# Patient Record
Sex: Female | Born: 1935 | Race: White | Hispanic: No | Marital: Married | Smoking: Former smoker
Health system: Southern US, Community
[De-identification: ages and names within clinical notes are randomized; demographics above are authoritative.]

## PROBLEM LIST (undated history)

## (undated) DIAGNOSIS — C3490 Malignant neoplasm of unspecified part of unspecified bronchus or lung: Secondary | ICD-10-CM

## (undated) DIAGNOSIS — C50919 Malignant neoplasm of unspecified site of unspecified female breast: Secondary | ICD-10-CM

## (undated) DIAGNOSIS — K219 Gastro-esophageal reflux disease without esophagitis: Secondary | ICD-10-CM

## (undated) HISTORY — PX: APPENDECTOMY: SHX54

## (undated) HISTORY — PX: TOTAL HIP ARTHROPLASTY: SHX124

## (undated) HISTORY — PX: GALLBLADDER SURGERY: SHX652

## (undated) HISTORY — PX: CHOLECYSTECTOMY: SHX55

## (undated) HISTORY — DX: Gastro-esophageal reflux disease without esophagitis: K21.9

## (undated) HISTORY — DX: Malignant neoplasm of unspecified part of unspecified bronchus or lung: C34.90

## (undated) HISTORY — PX: COLON RESECTION: SHX5231

## (undated) HISTORY — PX: TONSILLECTOMY: SUR1361

## (undated) HISTORY — DX: Malignant neoplasm of unspecified site of unspecified female breast: C50.919

## (undated) HISTORY — PX: REPLACEMENT TOTAL KNEE: SUR1224

---

## 1970-01-22 DIAGNOSIS — C3490 Malignant neoplasm of unspecified part of unspecified bronchus or lung: Secondary | ICD-10-CM

## 1970-01-22 HISTORY — DX: Malignant neoplasm of unspecified part of unspecified bronchus or lung: C34.90

## 2005-01-22 HISTORY — PX: MASTECTOMY: SHX3

## 2005-06-27 ENCOUNTER — Ambulatory Visit: Payer: Self-pay | Admitting: Gastroenterology

## 2005-08-15 ENCOUNTER — Inpatient Hospital Stay: Payer: Self-pay | Admitting: Vascular Surgery

## 2006-01-18 ENCOUNTER — Ambulatory Visit: Payer: Self-pay | Admitting: Internal Medicine

## 2006-06-27 ENCOUNTER — Ambulatory Visit: Payer: Self-pay | Admitting: Internal Medicine

## 2006-07-02 ENCOUNTER — Ambulatory Visit: Payer: Self-pay | Admitting: Internal Medicine

## 2006-10-21 ENCOUNTER — Ambulatory Visit: Payer: Self-pay | Admitting: General Practice

## 2006-11-04 ENCOUNTER — Inpatient Hospital Stay: Payer: Self-pay | Admitting: General Practice

## 2006-11-08 ENCOUNTER — Encounter: Payer: Self-pay | Admitting: Internal Medicine

## 2007-01-20 ENCOUNTER — Ambulatory Visit: Payer: Self-pay | Admitting: Internal Medicine

## 2007-05-20 ENCOUNTER — Ambulatory Visit: Payer: Self-pay | Admitting: General Practice

## 2007-05-21 ENCOUNTER — Ambulatory Visit: Payer: Self-pay

## 2007-06-18 ENCOUNTER — Ambulatory Visit: Payer: Self-pay | Admitting: Pain Medicine

## 2007-07-03 ENCOUNTER — Ambulatory Visit: Payer: Self-pay | Admitting: Pain Medicine

## 2007-07-16 ENCOUNTER — Ambulatory Visit: Payer: Self-pay | Admitting: Pain Medicine

## 2007-07-17 ENCOUNTER — Other Ambulatory Visit: Payer: Self-pay

## 2007-07-18 ENCOUNTER — Inpatient Hospital Stay: Payer: Self-pay | Admitting: Internal Medicine

## 2007-07-31 ENCOUNTER — Ambulatory Visit: Payer: Self-pay | Admitting: Pain Medicine

## 2007-08-14 ENCOUNTER — Ambulatory Visit: Payer: Self-pay | Admitting: Pain Medicine

## 2007-08-28 ENCOUNTER — Ambulatory Visit: Payer: Self-pay | Admitting: Physician Assistant

## 2008-01-21 ENCOUNTER — Ambulatory Visit: Payer: Self-pay | Admitting: Internal Medicine

## 2008-10-29 ENCOUNTER — Emergency Department: Payer: Self-pay | Admitting: Emergency Medicine

## 2009-02-03 ENCOUNTER — Encounter: Payer: Self-pay | Admitting: Cardiovascular Disease

## 2009-05-05 ENCOUNTER — Ambulatory Visit: Payer: Self-pay | Admitting: Internal Medicine

## 2009-05-10 ENCOUNTER — Ambulatory Visit: Payer: Self-pay | Admitting: Internal Medicine

## 2009-05-17 IMAGING — CR DG ABDOMEN 2V
1 series · 2 of 2 positions shown · non-contrast
Comparison: none

REASON FOR EXAM: abdominal pain, nausea/ vomiting
COMMENTS:

[Series 1: view not recorded · 0.17mm/px · 2 of 2 slices shown]
[im 1/2]
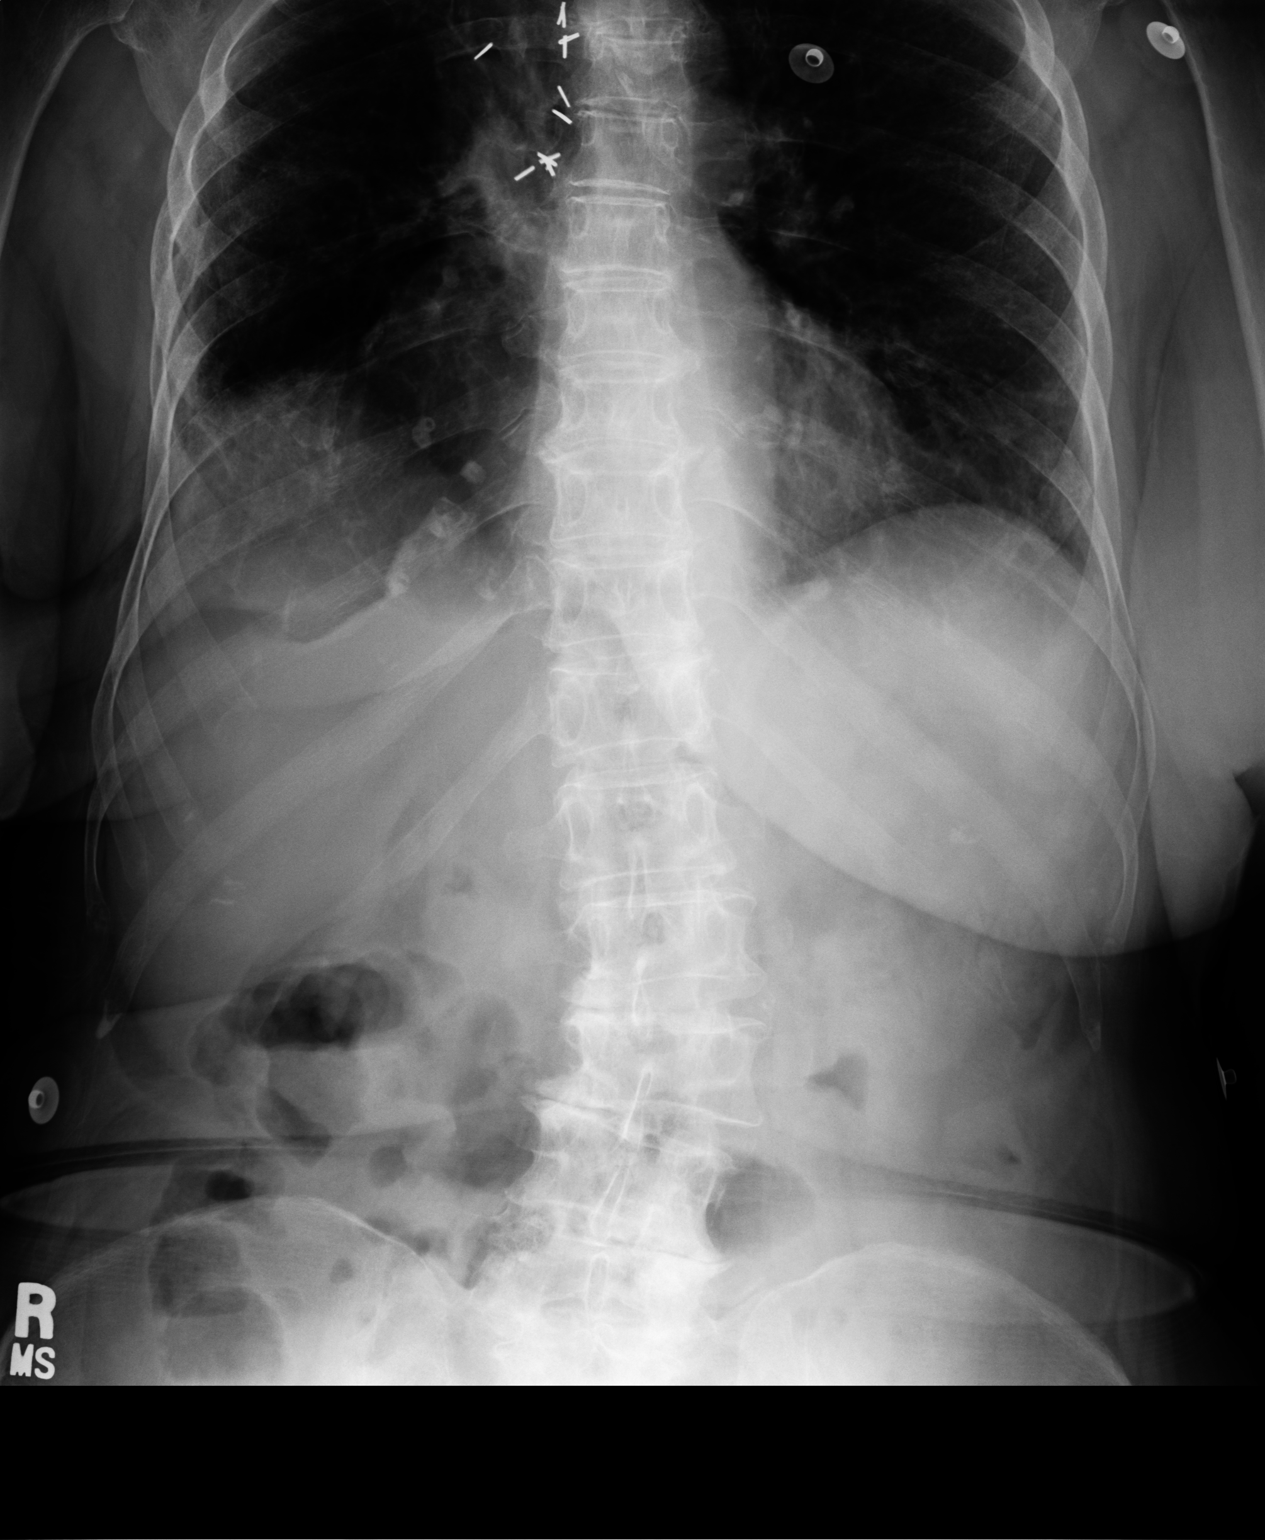
[im 2/2]
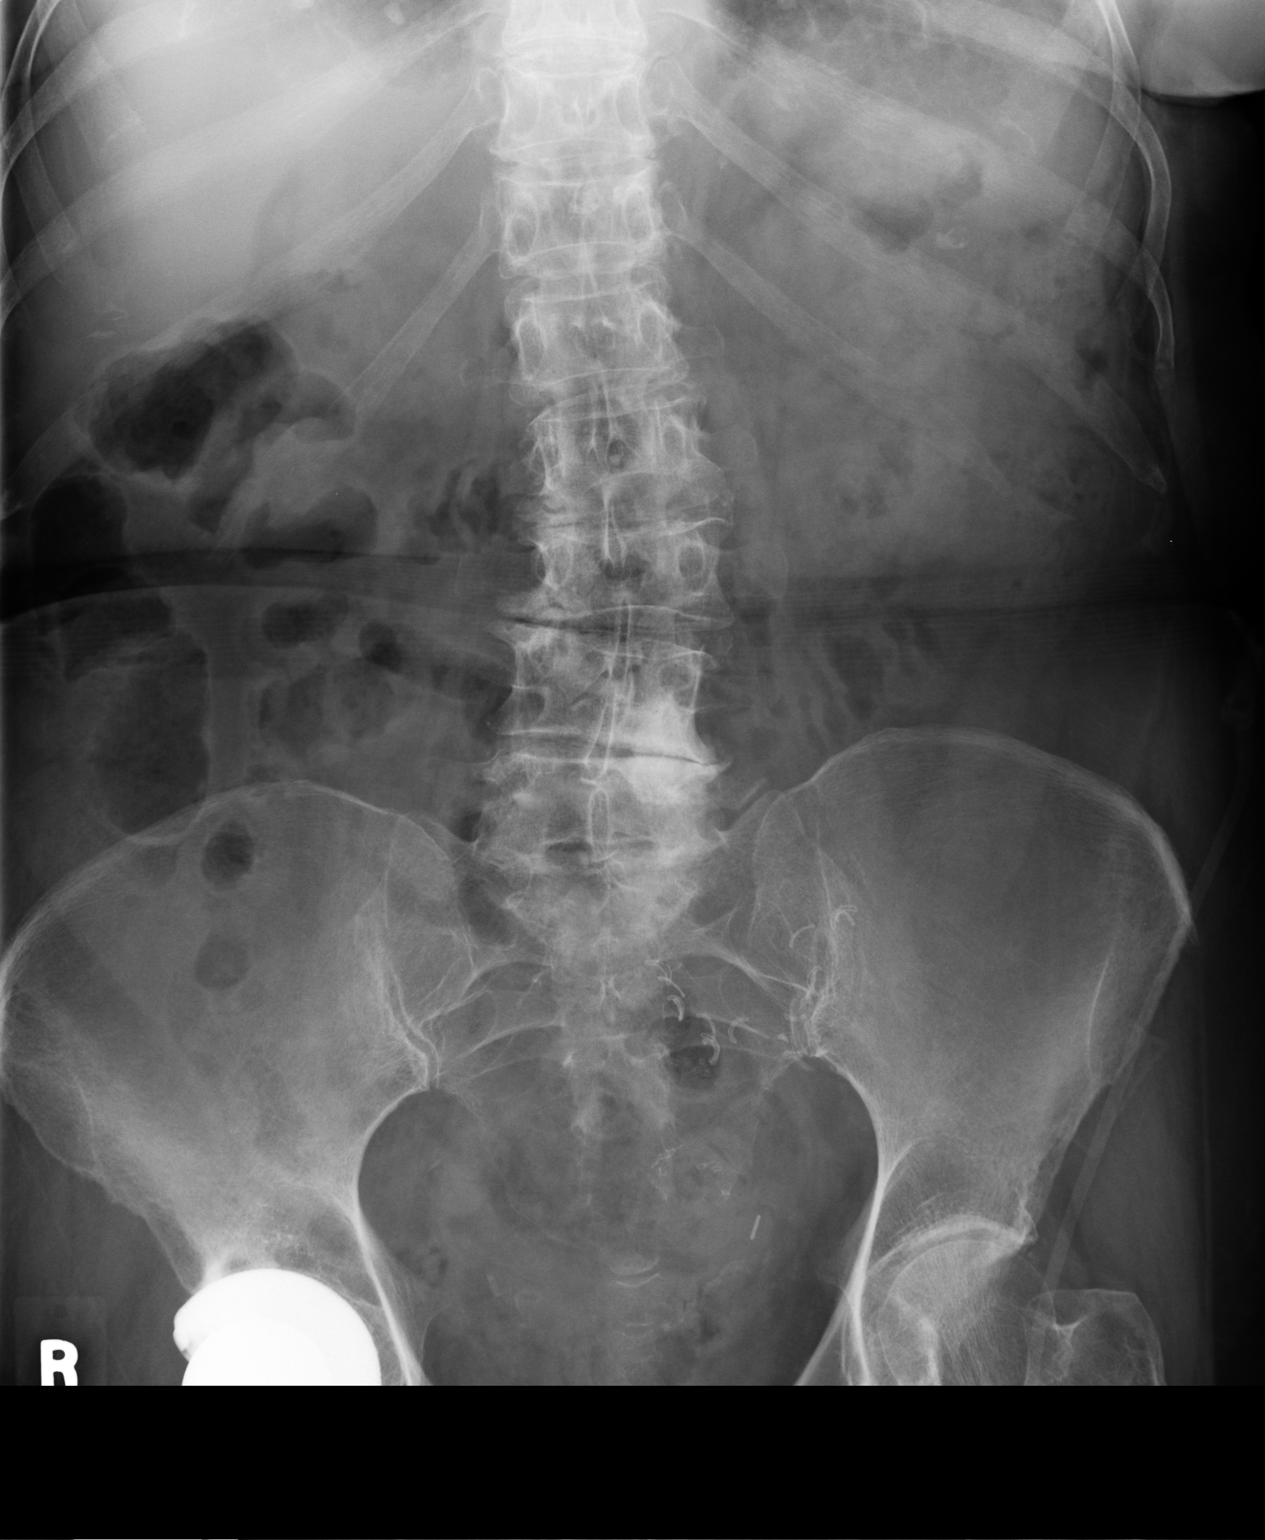

[2 of 2 positions shown; findings below may reference images not displayed]

PROCEDURE:     DXR - DXR ABDOMEN 2 V FLAT AND ERECT  - July 18, 2007  [DATE]

RESULT:     Air is seen within non-dilated loops of large and small bowel.
The visualized bony skeleton demonstrates levoscoliosis. A RIGHT hip
prosthesis is appreciated. Degenerative changes are appreciated within the
lower lumbar spine.
IMPRESSION: 1.Non-obstructive bowel gas pattern as described above.

## 2009-07-18 ENCOUNTER — Ambulatory Visit: Payer: Self-pay | Admitting: Cardiovascular Disease

## 2009-07-18 DIAGNOSIS — R079 Chest pain, unspecified: Secondary | ICD-10-CM

## 2009-07-18 DIAGNOSIS — M79609 Pain in unspecified limb: Secondary | ICD-10-CM

## 2009-08-11 ENCOUNTER — Encounter: Payer: Self-pay | Admitting: Cardiovascular Disease

## 2009-08-29 ENCOUNTER — Encounter: Payer: Self-pay | Admitting: Cardiovascular Disease

## 2009-09-16 ENCOUNTER — Ambulatory Visit: Payer: Self-pay | Admitting: Gastroenterology

## 2009-09-20 LAB — PATHOLOGY REPORT

## 2009-10-10 ENCOUNTER — Inpatient Hospital Stay (HOSPITAL_COMMUNITY): Admission: RE | Admit: 2009-10-10 | Discharge: 2009-10-12 | Payer: Self-pay | Admitting: Orthopedic Surgery

## 2010-02-21 NOTE — Assessment & Plan Note (Signed)
Summary: NP6/AMD   Visit Type:  Initial Consult Referring Provider:  Dr. Valentina Gu Primary Provider:  Irving Shows.  CC:  Needs a cardiac clearance for right knee surg.Angela Bridges  History of Present Illness: [name] is a very pleasant 77 year old woman with a history of lung cancer, status post lobectomy at age 13, mastectomy in 2005, severe diverticulosis status post colectomy, right hip replacement in 2009 now presenting with severe right knee discomfort with plan for right knee replacement. She presents for preoperative evaluation.  She states that over the past 2-3 years she has had episodes of chest discomfort. She describes it as a tightness. With these symptoms, she has fatigue, malaise, GI upset. Rarely, she has to take nitroglycerin. Her last episode was more than several months ago. Over the past 2-3 years, the episodes have been getting less frequent. She had a echocardiogram and stress test in 2009 at [facility]. She was told at that time that she had valve problems with 2 valves. The stress test was reportedly normal.  Currently she feels well. She is able to do walking, sharp for herself, clean house. She does not push vacuum to have someone help her with that. Activity does not cause chest discomfort  EKG shows normal sinus rhythm with a rate of 67 beats per minute, no significant ST or T wave changes.  Preventive Screening-Counseling & Management  Alcohol-Tobacco     Smoking Status: never  Caffeine-Diet-Exercise     Does Patient Exercise: no      Drug Use:  no.     Current Medications (verified): 1)  Aspir-Low 81 Mg Tbec (Aspirin) .Angela Bridges.. 1 Qd 2)  Tramadol Hcl 50 Mg Tabs (Tramadol Hcl) .... 2 At Bedtime 1 Every Am 3)  Tylenol Arthritis Pain 650 Mg Cr-Tabs (Acetaminophen) .... As Needed  Allergies (verified): 1)  ! Codeine Sulfate (Codeine Sulfate)  Past History:  Past Medical History: Cancer- lung  Past Surgical History: Mastectomy-right  2007 Gallstones-1958 Colon Resection-2009  Family History: Family History of Cancer: sisters Family History of Diabetes: sister Family History of Coronary Artery Disease: father Family History of CVA or Stroke: mother Family History of Hyperlipidemia:  Family History of Hypertension: mother  Social History: Retired  Married  Tobacco Use - No.  Alcohol Use - no Drug Use - no Regular Exercise - no Smoking Status:  never Drug Use:  no Does Patient Exercise:  no  Review of Systems       The patient complains of chest pain.  The patient denies fever, weight loss, weight gain, vision loss, decreased hearing, hoarseness, syncope, dyspnea on exertion, peripheral edema, prolonged cough, abdominal pain, incontinence, muscle weakness, depression, and enlarged lymph nodes.         GI upset episodes, fatigue on occassion  Vital Signs:  Patient profile:   78 year old female Height:      62 inches Weight:      137 pounds BMI:     25.15 BP sitting:   154 / 78  (left arm) Cuff size:   regular  Vitals Entered By: Bishop Dublin, CMA (July 18, 2009 2:27 PM)  Physical Exam  General:  Well developed, well nourished, in no acute distress. Head:  normocephalic and atraumatic Neck:  Neck supple, no JVD. No masses, thyromegaly or abnormal cervical nodes. Lungs:  Clear bilaterally to auscultation and percussion. Heart:  Non-displaced PMI, chest non-tender; regular rate and rhythm, S1, S2 without murmurs, rubs or gallops. Carotid upstroke normal, no bruit.  Pedals normal  pulses. No edema, no varicosities. Abdomen:  Bowel sounds positive; abdomen soft and non-tender without masses Skin:  Intact without lesions or rashes. Psych:  Normal affect.   Impression & Recommendations:  Problem # 1:  CHEST PAIN-UNSPECIFIED (ICD-786.50) she is presenting for preoperative evaluation prior to right knee replacement surgery. Etiology of her chest pain is uncertain. The frequency is getting less and she's  not had an episode in several months. It is concerning for noncardiac etiology, such as GI. Symptoms may be due to hiatal hernia, esophageal spasm or some other etiology. By report she had a negative stress test the we will obtain the records and also evaluate her echocardiogram.  On clinical exam, she does not appear to have significant valvular pathology. She is active with no chest pain with exertion. She would be an acceptable risk for any knee replacement surgery.  we have suggested that she continue on her aspirin, and call us if she has more frequent chest pain especially if she takes more nitroglycerin. We will obtain her most recent lipid panel for our records. There is no significant family history of coronary artery disease though she does not know the history of her father who passed away in his 1s.  Her updated medication list for this problem includes:    Aspir-low 81 Mg Tbec (Aspirin) .Angela Bridges... 1 qd  Problem # 2:  LIMB PAIN (ICD-729.5) She currently has severe right knee pain and is due to have cortisone shots. She states that if these do not work, she will proceed with knee replacement.   Appended Document: NP6/AMD Holter monitor was performed on August 2010 by outside facility for 48 hours with symptoms of chest pain, dizziness, presyncope.  Sinus rhythm was noted, minimum heart rate 53 overnight, maximum heart rate 126 at 9 AM in SVT. average heart rate 69 beats per minute. Rare APC noted. heart rate was highest in the morning and with exertion. One short run of 4 beats of ventricular ectopy, rare PVC, rare couplets episodes of SVT noted.  Appended Document: NP6/AMD stress test note received from aug 2010 she exercised for 3 minutes, achieved 4.6 metastases, peak blood pressure 136/88, maximum heart rate 133 beats per minute. No evidence of ischemia, normal LV function estimated at 75%.   Labs from January 2000 and showed TSH 2.9, creatinine 1.1, normal LFTs, no cholesterol  provided

## 2010-02-21 NOTE — Letter (Signed)
Summary: Medical Record Release from [facility]  Medical Record Release from [facility]   Imported By: Harlon Flor 08/29/2009 11:27:08  _____________________________________________________________________  External Attachment:    Type:   Image     Comment:   External Document

## 2010-02-21 NOTE — Letter (Signed)
Summary: Historic Patient File  Historic Patient File   Imported By: West Carbo 07/19/2009 10:27:57  _____________________________________________________________________  External Attachment:    Type:   Image     Comment:   External Document

## 2010-02-21 NOTE — Procedures (Signed)
Summary: Holter and Event  Holter and Event   Imported By: West Carbo 08/11/2009 13:24:55  _____________________________________________________________________  External Attachment:    Type:   Image     Comment:   External Document

## 2010-04-06 LAB — ABO/RH: ABO/RH(D): O POS

## 2010-04-06 LAB — CBC
HCT: 27.5 % — ABNORMAL LOW (ref 36.0–46.0)
HCT: 27.5 % — ABNORMAL LOW (ref 36.0–46.0)
HCT: 42.4 % (ref 36.0–46.0)
Hemoglobin: 9 g/dL — ABNORMAL LOW (ref 12.0–15.0)
Hemoglobin: 13.9 g/dL (ref 12.0–15.0)
MCH: 29.8 pg (ref 26.0–34.0)
MCH: 29.9 pg (ref 26.0–34.0)
MCH: 30 pg (ref 26.0–34.0)
MCHC: 32.6 g/dL (ref 30.0–36.0)
MCHC: 32.7 g/dL (ref 30.0–36.0)
MCHC: 33.1 g/dL (ref 30.0–36.0)
MCHC: 32.8 g/dL (ref 30.0–36.0)
MCV: 91.2 fL (ref 78.0–100.0)
MCV: 91.4 fL (ref 78.0–100.0)
MCV: 91.4 fL (ref 78.0–100.0)
Platelets: 206 10*3/uL (ref 150–400)
Platelets: 210 10*3/uL (ref 150–400)
Platelets: 215 10*3/uL (ref 150–400)
Platelets: 228 10*3/uL (ref 150–400)
RBC: 3.01 MIL/uL — ABNORMAL LOW (ref 3.87–5.11)
RBC: 4.64 MIL/uL (ref 3.87–5.11)
RDW: 13.2 % (ref 11.5–15.5)
RDW: 13.3 % (ref 11.5–15.5)
RDW: 13.4 % (ref 11.5–15.5)
RDW: 12.9 % (ref 11.5–15.5)
WBC: 10.9 10*3/uL — ABNORMAL HIGH (ref 4.0–10.5)
WBC: 9.4 10*3/uL (ref 4.0–10.5)
WBC: 8.6 10*3/uL (ref 4.0–10.5)

## 2010-04-06 LAB — TYPE AND SCREEN: Antibody Screen: NEGATIVE

## 2010-04-06 LAB — BASIC METABOLIC PANEL
BUN: 13 mg/dL (ref 6–23)
CO2: 24 mEq/L (ref 19–32)
Calcium: 7.9 mg/dL — ABNORMAL LOW (ref 8.4–10.5)
Calcium: 8 mg/dL — ABNORMAL LOW (ref 8.4–10.5)
Chloride: 101 mEq/L (ref 96–112)
Creatinine, Ser: 1.04 mg/dL (ref 0.4–1.2)
GFR calc Af Amer: >60 mL/min (ref >60)
GFR calc non Af Amer: 50 mL/min — ABNORMAL LOW (ref >60)
Glucose, Bld: 102 mg/dL — ABNORMAL HIGH (ref 70–99)
Glucose, Bld: 106 mg/dL — ABNORMAL HIGH (ref 70–99)
Sodium: 141 mEq/L (ref 135–145)

## 2010-04-06 LAB — URINALYSIS, ROUTINE W REFLEX MICROSCOPIC
Bilirubin Urine: NEGATIVE
Bilirubin Urine: NEGATIVE
Glucose, UA: NEGATIVE mg/dL
Hgb urine dipstick: NEGATIVE
Ketones, ur: NEGATIVE mg/dL
Leukocytes, UA: NEGATIVE
Nitrite: NEGATIVE
Protein, ur: NEGATIVE mg/dL
Specific Gravity, Urine: 1.017 (ref 1.005–1.030)
pH: 6.5 (ref 5.0–8.0)
pH: 5.5 (ref 5.0–8.0)

## 2010-04-06 LAB — COMPREHENSIVE METABOLIC PANEL
ALT: 16 U/L (ref 0–35)
AST: 24 U/L (ref 0–37)
Albumin: 3.7 g/dL (ref 3.5–5.2)
Alkaline Phosphatase: 69 U/L (ref 39–117)
BUN: 13 mg/dL (ref 6–23)
CO2: 28 mEq/L (ref 19–32)
Calcium: 9.8 mg/dL (ref 8.4–10.5)
Chloride: 103 mEq/L (ref 96–112)
Creatinine, Ser: 1 mg/dL (ref 0.4–1.2)
GFR calc Af Amer: >60 mL/min (ref >60)
GFR calc non Af Amer: 52 mL/min — ABNORMAL LOW (ref >60)
Glucose, Bld: 92 mg/dL (ref 70–99)
Potassium: 5.1 mEq/L (ref 3.5–5.1)
Sodium: 139 mEq/L (ref 135–145)
Total Bilirubin: 0.8 mg/dL (ref 0.3–1.2)
Total Protein: 6.8 g/dL (ref 6.0–8.3)

## 2010-04-06 LAB — SURGICAL PCR SCREEN
MRSA, PCR: NEGATIVE
Staphylococcus aureus: NEGATIVE

## 2010-04-06 LAB — APTT: aPTT: 35 seconds (ref 24–37)

## 2010-04-06 LAB — DIFFERENTIAL
Basophils Absolute: 0 10*3/uL (ref 0.0–0.1)
Basophils Relative: 1 % (ref 0–1)
Eosinophils Absolute: 0.2 10*3/uL (ref 0.0–0.7)
Eosinophils Relative: 3 % (ref 0–5)
Lymphocytes Relative: 25 % (ref 12–46)
Lymphs Abs: 2.2 10*3/uL (ref 0.7–4.0)
Monocytes Absolute: 0.6 10*3/uL (ref 0.1–1.0)
Monocytes Relative: 7 % (ref 3–12)
Neutro Abs: 5.6 10*3/uL (ref 1.7–7.7)
Neutrophils Relative %: 65 % (ref 43–77)

## 2010-04-06 LAB — URINE CULTURE
Colony Count: NO GROWTH
Culture  Setup Time: 201109190927
Culture  Setup Time: 201109131942

## 2010-04-06 LAB — URINE MICROSCOPIC-ADD ON

## 2010-04-06 LAB — PREPARE RBC (CROSSMATCH)

## 2010-05-08 ENCOUNTER — Ambulatory Visit: Payer: Self-pay | Admitting: Internal Medicine

## 2010-06-27 ENCOUNTER — Encounter: Payer: Self-pay | Admitting: Cardiovascular Disease

## 2010-06-27 ENCOUNTER — Ambulatory Visit (INDEPENDENT_AMBULATORY_CARE_PROVIDER_SITE_OTHER): Payer: Medicare Other | Admitting: Cardiovascular Disease

## 2010-06-27 DIAGNOSIS — M79609 Pain in unspecified limb: Secondary | ICD-10-CM

## 2010-06-27 DIAGNOSIS — R110 Nausea: Secondary | ICD-10-CM

## 2010-06-27 DIAGNOSIS — R079 Chest pain, unspecified: Secondary | ICD-10-CM

## 2010-06-27 DIAGNOSIS — R42 Dizziness and giddiness: Secondary | ICD-10-CM

## 2010-06-27 DIAGNOSIS — E785 Hyperlipidemia, unspecified: Secondary | ICD-10-CM

## 2010-06-27 NOTE — Assessment & Plan Note (Signed)
She has dizziness when she moves her head that does not appear to be secondary to postural hypotension. With these symptoms, she has malaise and nausea. Sometimes they happen with exertion, sometimes at rest. No further cardiac workup at this time. Symptoms have been ongoing for several years but worse recently. She may benefit from additional vestibular workup.

## 2010-06-27 NOTE — Progress Notes (Signed)
   Patient ID: Angela Bridges, female    DOB: [birth year], 77 y.o.   MRN: [id number]  HPI Comments: Angela Bridges is a very pleasant 77 year old woman with a history of lung cancer, status post lobectomy at age 77, mastectomy in 2005, severe diverticulosis status post colectomy, right hip replacement in 2009 now presenting 4 routine followup.  She reports that she has had chronic leg pain in the posterior aspect of both legs that comes on at nighttime. She has tried various positions for her legs including a pillow and nothing seems to help. She does report having such severe pain that she has nightmares. She believes that she was sleep walking out of her bed one day and she fell and hurt herself. She did not hit her head.  She has also been having episodes of dizziness with associated nausea that comes on sometimes after exertion for up to 20 minutes. She is able to use the exercise bike with no symptoms, walking in a grocery store leaning on a cart with no symptoms. Other times when she exerts herself without support, she has dizziness and nausea. This has been worse in the past several weeks that has been going on for several years.   On previous visits, she had reported episodes of chest tightness. No recent chest pain symptoms  She had a echocardiogram and stress test in 2009 at [facility]. She was told at that time that she had valve problems with 2 valves.     EKG shows normal sinus rhythm with a rate of 66 beats per minute, no significant ST or T wave changes.      Review of Systems  Constitutional: Negative.   HENT: Negative.   Eyes: Negative.   Respiratory: Negative.   Cardiovascular: Negative.   Gastrointestinal: Positive for nausea.  Musculoskeletal: Positive for myalgias.       Leg pain at nighttime  Skin: Negative.   Neurological: Positive for dizziness.  Hematological: Negative.   Psychiatric/Behavioral: Negative.   All other systems reviewed and are  negative.    BP 114/68  Pulse 66  Ht 5\' 2"  (1.575 m)  Wt 138 lb 6.4 oz (62.778 kg)  BMI 25.31 kg/m2   Physical Exam  Nursing note and vitals reviewed. Constitutional: She is oriented to person, place, and time. She appears well-developed and well-nourished.  HENT:  Head: Normocephalic.  Nose: Nose normal.  Mouth/Throat: Oropharynx is clear and moist.  Eyes: Conjunctivae are normal. Pupils are equal, round, and reactive to light.  Neck: Normal range of motion. Neck supple. No JVD present.  Cardiovascular: Normal rate, regular rhythm, S1 normal, S2 normal, normal heart sounds and intact distal pulses.  Exam reveals no gallop and no friction rub.   No murmur heard. Pulmonary/Chest: Effort normal and breath sounds normal. No respiratory distress. She has no wheezes. She has no rales. She exhibits no tenderness.  Abdominal: Soft. Bowel sounds are normal. She exhibits no distension. There is no tenderness.  Musculoskeletal: Normal range of motion. She exhibits no edema and no tenderness.  Lymphadenopathy:    She has no cervical adenopathy.  Neurological: She is alert and oriented to person, place, and time. Coordination normal.  Skin: Skin is warm and dry. No rash noted. No erythema.  Psychiatric: She has a normal mood and affect. Her behavior is normal. Judgment and thought content normal.         Assessment and Plan

## 2010-06-27 NOTE — Patient Instructions (Signed)
You are doing well. No medication changes were made. Please call us if you have new issues that need to be addressed before your next appt.  We will call you for a follow up Appt. In 6 months  

## 2010-06-27 NOTE — Assessment & Plan Note (Signed)
Etiology of her limb pain is uncertain. Unclear if this is restless leg syndrome. She could potentially try clonazepam or other restless leg medications such as requip. She is desperate to try something as her symptoms are severe. We have asked her to discuss this with Dr. Graciela Husbands. No suggestion of claudication symptoms and previous ultrasound per her report was essentially normal.

## 2010-06-27 NOTE — Assessment & Plan Note (Signed)
No recent episodes of chest pain. No further workup at this time 

## 2011-05-08 ENCOUNTER — Emergency Department: Payer: Self-pay | Admitting: *Deleted

## 2011-05-14 ENCOUNTER — Ambulatory Visit: Payer: Self-pay | Admitting: Internal Medicine

## 2012-05-14 ENCOUNTER — Ambulatory Visit: Payer: Self-pay | Admitting: Internal Medicine

## 2013-05-19 ENCOUNTER — Ambulatory Visit: Payer: Self-pay | Admitting: Internal Medicine

## 2013-07-02 ENCOUNTER — Emergency Department: Payer: Self-pay | Admitting: Internal Medicine

## 2013-11-09 ENCOUNTER — Emergency Department: Payer: Self-pay | Admitting: Emergency Medicine

## 2013-11-09 ENCOUNTER — Telehealth: Payer: Self-pay | Admitting: *Deleted

## 2013-11-09 LAB — CBC
HCT: 45 % (ref 35.0–47.0)
HGB: 14.6 g/dL (ref 12.0–16.0)
MCH: 29.1 pg (ref 26.0–34.0)
MCHC: 32.5 g/dL (ref 32.0–36.0)
MCV: 89 fL (ref 80–100)
PLATELETS: 325 10*3/uL (ref 150–440)
RBC: 5.04 10*6/uL (ref 3.80–5.20)
RDW: 14.1 % (ref 11.5–14.5)
WBC: 11.6 10*3/uL — AB (ref 3.6–11.0)

## 2013-11-09 LAB — TROPONIN I
Troponin-I: <0.02 ng/mL
Troponin-I: <0.02 ng/mL

## 2013-11-09 LAB — COMPREHENSIVE METABOLIC PANEL
Albumin: 3.4 g/dL (ref 3.4–5.0)
Alkaline Phosphatase: 72 U/L
Anion Gap: 9 (ref 7–16)
BUN: 14 mg/dL (ref 7–18)
Bilirubin,Total: 0.5 mg/dL (ref 0.2–1.0)
CREATININE: 1 mg/dL (ref 0.60–1.30)
Calcium, Total: 8.7 mg/dL (ref 8.5–10.1)
Chloride: 97 mmol/L — ABNORMAL LOW (ref 98–107)
Co2: 27 mmol/L (ref 21–32)
EGFR (African American): >60
EGFR (Non-African Amer.): 55 — ABNORMAL LOW
GLUCOSE: 96 mg/dL (ref 65–99)
Osmolality: 267 (ref 275–301)
POTASSIUM: 4.8 mmol/L (ref 3.5–5.1)
SGOT(AST): 26 U/L (ref 15–37)
SGPT (ALT): 18 U/L
Sodium: 133 mmol/L — ABNORMAL LOW (ref 136–145)
TOTAL PROTEIN: 7.2 g/dL (ref 6.4–8.2)

## 2013-11-09 LAB — CK TOTAL AND CKMB (NOT AT ARMC): CK, Total: 43 U/L

## 2013-11-09 NOTE — Telephone Encounter (Signed)
Pt is in [facility] ED.

## 2013-11-09 NOTE — Telephone Encounter (Signed)
Hard to say what is going on. We have not seen her for more than 3 years If symptoms persist, would certainly call EMS  Uncertain if nitroglycerin is several years old at this time

## 2013-11-09 NOTE — Telephone Encounter (Signed)
Patient called and stated she is having left sided chest pain  The pain is under her left breast She took a nitro tablet and the pain was mildly relieved  Her BP post nitro was 163/111 Patient sounded verbally distressed  She wanted Dr. Rockey Situ to see her today to tell her if she was having a heart attack  She stated her jaw and face "feels like pins and needles"   I informed patient that Dr. Rockey Situ was not in clinic this afternoon  She will need to contact EMS right away

## 2013-11-13 ENCOUNTER — Encounter (INDEPENDENT_AMBULATORY_CARE_PROVIDER_SITE_OTHER): Payer: Self-pay

## 2013-11-13 ENCOUNTER — Ambulatory Visit (INDEPENDENT_AMBULATORY_CARE_PROVIDER_SITE_OTHER): Payer: Medicare Other | Admitting: Cardiovascular Disease

## 2013-11-13 ENCOUNTER — Encounter: Payer: Self-pay | Admitting: Cardiovascular Disease

## 2013-11-13 VITALS — BP 140/82 | HR 64 | Ht 62.0 in | Wt 143.2 lb

## 2013-11-13 DIAGNOSIS — R0602 Shortness of breath: Secondary | ICD-10-CM

## 2013-11-13 DIAGNOSIS — C3490 Malignant neoplasm of unspecified part of unspecified bronchus or lung: Secondary | ICD-10-CM

## 2013-11-13 DIAGNOSIS — R42 Dizziness and giddiness: Secondary | ICD-10-CM

## 2013-11-13 DIAGNOSIS — K5730 Diverticulosis of large intestine without perforation or abscess without bleeding: Secondary | ICD-10-CM

## 2013-11-13 DIAGNOSIS — Z9889 Other specified postprocedural states: Secondary | ICD-10-CM

## 2013-11-13 DIAGNOSIS — Z9049 Acquired absence of other specified parts of digestive tract: Secondary | ICD-10-CM

## 2013-11-13 DIAGNOSIS — E785 Hyperlipidemia, unspecified: Secondary | ICD-10-CM

## 2013-11-13 DIAGNOSIS — I209 Angina pectoris, unspecified: Secondary | ICD-10-CM

## 2013-11-13 DIAGNOSIS — M79606 Pain in leg, unspecified: Secondary | ICD-10-CM

## 2013-11-13 NOTE — Progress Notes (Signed)
Patient ID: Angela Bridges, female    DOB: 13-Aug-1919, 80 y.o.   MRN: [id number]  HPI Comments: Angela Bridges is a very pleasant 80 year old woman with a history of lung cancer, status post lobectomy at age 80, mastectomy in 2005, severe diverticulosis status post colectomy, right hip replacement in 2009, who presents for evaluation after recent episodes of chest pain.  She recently went to the emergency room 11/09/2013 with chest pain radiating beneath her left and right breast. Pain was severe, 8 or 9/10, lasted for quite some time. She took nitroglycerin with some improvement of her discomfort and she went to the emergency room. Workup in the ER was essentially normal with negative cardiac enzymes, essentially normal EKG, sodium 133, normal renal function. She was discharged with followup today Chest x-ray in the hospital showed calcified tortuous aorta  On discussion today, she reports that she has had a long history of chest pain, worse recently. She describes a different chest pain on a more regular basis in her upper chest described as a tightness. Sometimes comes on at rest, sometimes with exertion. Sometimes has to take nitroglycerin.  History of chronic leg pain in the posterior aspect of both legs that comes on at nighttime. She has tried various positions for her legs including a pillow and nothing seems to help. She does report having such severe pain that she has nightmares.   She's able to use a exercise bike for up to 15 minutes at a slow pace. Typically does not have chest pain. Very unsteady gait, does not go walking very much. Also has some shortness of breath with exertion. Periodic dizziness    She had a echocardiogram and stress test in 2009 at [facility]. She was told at that time that she had valve problems with 2 valves.  No workup since that time     EKG shows normal sinus rhythm with a rate of 64 beats per minute, no significant ST or T wave  changes.    Outpatient Encounter Prescriptions as of 11/13/2013  Medication Sig  . aspirin 81 MG EC tablet Take 81 mg by mouth daily.    . cholecalciferol (VITAMIN D) 1000 UNITS tablet Take 1,000 Units by mouth daily.  [name] glucosamine-chondroitin 500-400 MG tablet Take 1 tablet by mouth 3 (three) times daily.  . naproxen sodium (RA NAPROXEN SODIUM) 220 MG tablet Take by mouth at bedtime and may repeat dose one time if needed.   . nitroGLYCERIN (NITROSTAT) 0.4 MG SL tablet Place under the tongue.  . Omega-3 Fatty Acids (FISH OIL) 1000 MG CAPS Take 1,000 mg by mouth daily.  . pantoprazole (PROTONIX) 40 MG tablet Take 40 mg by mouth daily.   . [DISCONTINUED] acetaminophen (TYLENOL) 650 MG CR tablet Take 650 mg by mouth every 8 (eight) hours as needed.    . [DISCONTINUED] traMADol (ULTRAM) 50 MG tablet Take 50 mg by mouth at bedtime.      Review of Systems  Constitutional: Negative.   HENT: Negative.   Eyes: Negative.   Respiratory: Positive for chest tightness.   Cardiovascular: Positive for chest pain.  Gastrointestinal: Negative.   Endocrine: Negative.   Musculoskeletal: Positive for arthralgias and gait problem.  Skin: Negative.   Allergic/Immunologic: Negative.   Neurological: Negative.   Hematological: Negative.   Psychiatric/Behavioral: Negative.   All other systems reviewed and are negative.   BP 140/82  Pulse 64  Ht 5\' 2"  (1.575 m)  Wt 143 lb 4 oz (64.978 kg)  BMI  26.19 kg/m2  Physical Exam  Nursing note and vitals reviewed. Constitutional: She is oriented to person, place, and time. She appears well-developed and well-nourished.  HENT:  Head: Normocephalic.  Nose: Nose normal.  Mouth/Throat: Oropharynx is clear and moist.  Eyes: Conjunctivae are normal. Pupils are equal, round, and reactive to light.  Neck: Normal range of motion. Neck supple. No JVD present.  Cardiovascular: Normal rate, regular rhythm, S1 normal, S2 normal, normal heart sounds and intact distal  pulses.  Exam reveals no gallop and no friction rub.   No murmur heard. Pulmonary/Chest: Effort normal and breath sounds normal. No respiratory distress. She has no wheezes. She has no rales. She exhibits no tenderness.  Abdominal: Soft. Bowel sounds are normal. She exhibits no distension. There is no tenderness.  Musculoskeletal: Normal range of motion. She exhibits no edema and no tenderness.  Lymphadenopathy:    She has no cervical adenopathy.  Neurological: She is alert and oriented to person, place, and time. Coordination normal.  Skin: Skin is warm and dry. No rash noted. No erythema.  Psychiatric: She has a normal mood and affect. Her behavior is normal. Judgment and thought content normal.    Assessment and Plan

## 2013-11-13 NOTE — Patient Instructions (Addendum)
For chest pain, we will schedule a lexiscan myoview on Friday October 30th No food the morning of the test, No caffeine for 24 hours before the test  Please call us if you have new issues that need to be addressed before your next appt.    [place]  Your caregiver has ordered a Stress Test with nuclear imaging. The purpose of this test is to evaluate the blood supply to your heart muscle. This procedure is referred to as a "Non-Invasive Stress Test." This is because other than having an IV started in your vein, nothing is inserted or "invades" your body. Cardiac stress tests are done to find areas of poor blood flow to the heart by determining the extent of coronary artery disease (CAD). Some patients exercise on a treadmill, which naturally increases the blood flow to your heart, while others who are  unable to walk on a treadmill due to physical limitations have a pharmacologic/chemical stress agent called Lexiscan . This medicine will mimic walking on a treadmill by temporarily increasing your coronary blood flow.   Please note: these test may take anywhere between 2-4 hours to complete  PLEASE REPORT TO [facility] AT THE FIRST DESK WILL DIRECT YOU WHERE TO GO  Date of Procedure:_____Friday 11/20/13 at 7:30 am________________________________  Arrival Time for Procedure:________7:15 am______________________  Instructions regarding medication:   ____ : Hold diabetes medication morning of procedure  ____:  Hold betablocker(s) night before procedure and morning of procedure  ____:  Hold other medications as follows:_________________________________________________________________________________________________________________________________________________________________________________________________________________________________________________________________________________________  PLEASE NOTIFY THE OFFICE AT LEAST 24 HOURS IN ADVANCE IF YOU  ARE UNABLE TO KEEP YOUR APPOINTMENT.  [phone] AND  PLEASE NOTIFY NUCLEAR MEDICINE AT [facility] AT LEAST 24 HOURS IN ADVANCE IF YOU ARE UNABLE TO KEEP YOUR APPOINTMENT. [phone]  How to prepare for your Myoview test:  1. Do not eat or drink after midnight 2. No caffeine for 24 hours prior to test 3. No smoking 24 hours prior to test. 4. Your medication may be taken with water.  If your doctor stopped a medication because of this test, do not take that medication. 5. Ladies, please do not wear dresses.  Skirts or pants are appropriate. Please wear a short sleeve shirt. 6. No perfume, cologne or lotion. 7. Wear comfortable walking shoes. No heels!

## 2013-11-14 DIAGNOSIS — Z9049 Acquired absence of other specified parts of digestive tract: Secondary | ICD-10-CM | POA: Insufficient documentation

## 2013-11-14 DIAGNOSIS — E785 Hyperlipidemia, unspecified: Secondary | ICD-10-CM | POA: Insufficient documentation

## 2013-11-14 DIAGNOSIS — K5730 Diverticulosis of large intestine without perforation or abscess without bleeding: Secondary | ICD-10-CM | POA: Insufficient documentation

## 2013-11-14 NOTE — Assessment & Plan Note (Signed)
Chronic pain in her legs. Does not walk on a regular basis.

## 2013-11-14 NOTE — Assessment & Plan Note (Signed)
Etiology of her chest pain is unclear. Some typical as well as atypical features. She is unable to treadmill. Prior treadmill in 2010 she walked only 3 minutes. Now legs are weaker, more unsteady. We will order a pharmacologic Myoview for next week to rule out ischemia

## 2013-11-14 NOTE — Assessment & Plan Note (Signed)
She's not particularly interested in a cholesterol medication. Most recent total cholesterol 230

## 2013-11-14 NOTE — Assessment & Plan Note (Signed)
She does report episodes of dizziness. No clear orthostatic features. Recommended that she monitor her blood pressure when she is dizzy. If she has hypotension, could adjust her medications

## 2013-11-20 ENCOUNTER — Encounter: Payer: Self-pay | Admitting: Cardiovascular Disease

## 2013-11-20 ENCOUNTER — Ambulatory Visit: Payer: Self-pay | Admitting: Cardiovascular Disease

## 2013-11-20 DIAGNOSIS — R079 Chest pain, unspecified: Secondary | ICD-10-CM

## 2013-11-26 ENCOUNTER — Ambulatory Visit: Payer: PRIVATE HEALTH INSURANCE | Admitting: Cardiovascular Disease

## 2014-02-22 ENCOUNTER — Ambulatory Visit: Payer: Self-pay | Admitting: Internal Medicine

## 2014-03-09 ENCOUNTER — Inpatient Hospital Stay: Payer: Self-pay | Admitting: Internal Medicine

## 2014-03-12 DIAGNOSIS — I340 Nonrheumatic mitral (valve) insufficiency: Secondary | ICD-10-CM

## 2014-03-23 ENCOUNTER — Ambulatory Visit
Admit: 2014-03-23 | Disposition: A | Discharge status: Home / Self Care | Payer: Self-pay | Attending: Internal Medicine | Admitting: Internal Medicine

## 2014-04-19 ENCOUNTER — Telehealth: Payer: Self-pay | Admitting: *Deleted

## 2014-04-19 NOTE — Telephone Encounter (Signed)
pt is asking since she was recently diagnose with cancer.  She is now in hospice.  She knows she has an apt coming up but does not feel like doing anything. She is asking that apt is a 6 month fu, does she really need to come.  Please advise.

## 2014-04-19 NOTE — Telephone Encounter (Signed)
Spoke w/ pt.  Advised her that she does not need to come in unless she has any concerns.  She verbalizes understanding and wants Dr. Rockey Situ to know that she is not cancelling her appt due to unhappiness with our office, but b/c she is in Hospice care. She reports "I have cancer.  It's big and inoperable and they are giving me about 2-3 months". Offered comfort to pt and asked her to call us if there is anything we can do for her.

## 2014-05-07 ENCOUNTER — Other Ambulatory Visit: Payer: Self-pay | Admitting: Internal Medicine

## 2014-05-07 DIAGNOSIS — Z1231 Encounter for screening mammogram for malignant neoplasm of breast: Secondary | ICD-10-CM

## 2014-05-18 ENCOUNTER — Ambulatory Visit: Payer: Medicare Other | Admitting: Cardiovascular Disease

## 2014-05-23 NOTE — H&P (Signed)
PATIENT NAME:  Angela Bridges, Angela Bridges MR#:  [id number] DATE OF BIRTH:  [birth year]  DATE OF ADMISSION:  03/09/2014  REFERRING PHYSICIAN: Hinda Kehr MD   PRIMARY CARE PHYSICIAN: Ramonita Lab, MD  ADMISSION DIAGNOSIS: Pneumonia.   HISTORY OF PRESENT ILLNESS: This is a 81 year old Caucasian female who presents to the Emergency Department complaining of a terrible cough that has been productive of yellow sputum. The patient has also felt short of breath for 6 days now. Her husband occasionally uses supplemental oxygen at home and she felt the need to use some herself. She states this made her feel better and specifically relieved some of her lightheadedness following spasms of cough. She admits to some dull chest pain that is associated with her cough. She has not been able to sleep well due to the same. She admits to some fevers at home with a maximum temperature of 100.4 yesterday. On arrival to the Emergency Department, the patient was found to be hypoxic to 86% on room air. Chest x-ray revealed a pleural effusion on the right as well as likely right lower lobe pneumonia which prompted the Emergency Department to call for admission.   REVIEW OF SYSTEMS: CONSTITUTIONAL: The patient admits to fever and generalized weakness.  EYES: Denies blurred vision or inflammation.  EARS, NOSE AND THROAT: Denies tinnitus or sore throat.  RESPIRATORY: Admits to cough and shortness of breath. CARDIOVASCULAR: Admits to chest pain associated with cough but denies orthopnea, paroxysmal nocturnal dyspnea, or palpitations.  GASTROINTESTINAL: Denies nausea, vomiting, diarrhea, or abdominal pain.  GENITOURINARY: Denies dysuria, increased frequency, or hesitancy of urination.  ENDOCRINE: Denies polyuria, polydipsia.  HEMATOLOGIC AND LYMPHATIC: Denies easy bruising or bleeding.  INTEGUMENTARY: Denies rashes or lesions.  MUSCULOSKELETAL: Denies arthralgias or myalgias.  NEUROLOGIC: Denies numbness in her extremities or  dysarthria.  PSYCHIATRIC: Denies depression or suicidal ideation.   PAST MEDICAL HISTORY: Gastroesophageal reflux disease.   PAST SURGICAL HISTORY: Right upper lobectomy, mastectomy, right hip replacement, right total knee replacement, partial colectomy for diverticulitis, as well as cholecystectomy.   SOCIAL HISTORY: The patient lives with her husband. She does not smoke, drink, or do any drugs.   FAMILY HISTORY: Significant for coronary artery disease in her father.   MEDICATIONS: Pantoprazole 40 mg 1 tablet p.o. daily.   ALLERGIES: Azithromycin, codeine, tramadol and possibly ceftriaxone.   PERTINENT LABORATORY RESULTS AND RADIOGRAPHIC FINDINGS: Serum glucose 114, BUN 13, creatinine 1.07, serum sodium 128, potassium 4.9, chloride 96, bicarb 25, calcium 8.6. Phosphorus 2.1. Magnesium 1.7. Serum albumin 2.1, alk phos 170, AST 50, ALT 40. Troponin is negative. White blood cell count 29.2, hemoglobin 12.1, hematocrit 37, platelet count 367,000, MCV 87. INR 1.2. Urinalysis is negative for infection. Lactic acid 1.3.   Chest x-ray shows a lobulated potentially loculated small to moderate right pleural effusion that could better be characterized by CT scan of the chest with contrast. There is mild cardiomegaly and mild chronic interstitial changes. She is status post right upper lobectomy.   PHYSICAL EXAMINATION: VITAL SIGNS: Temperature 99.4, pulse 96, respirations 18, blood pressure 126/54, pulse oximetry 96% on room air.  GENERAL: The patient is alert and oriented x3, in no apparent distress.  HEENT: Normocephalic, atraumatic. Pupils equal, round, and reactive to light and accommodation. Extraocular movements are intact. Mucous membranes are moist.  NECK: Trachea is midline. No adenopathy. Thyroid is nonpalpable and nontender.  CHEST: Symmetric and atraumatic.  CARDIOVASCULAR: Regular rate and rhythm. Normal S1, S2. No rubs, clicks, or murmurs appreciated.  LUNGS: Clear to auscultation  bilaterally. Normal effort and excursion.  ABDOMEN: Positive bowel sounds. Soft, nontender, nondistended. No hepatosplenomegaly.  GENITOURINARY: Deferred.  MUSCULOSKELETAL: The patient moves all 4 extremities equally. I have not walked the patient.  SKIN: Warm and dry. There are no rashes or lesions.  EXTREMITIES: No clubbing, cyanosis, or edema.  NEUROLOGIC: Cranial nerves II through XII are grossly intact.  PSYCHIATRIC: Mood is normal. Affect is congruent. The patient has excellent insight and judgment into her medical condition. She has slightly poor short-term memory, especially in terms of serial instructions.   ASSESSMENT AND PLAN:  1.  This is a 81 year old female admitted for pneumonia. The patient's productive cough, chest x-ray findings, fever and hypoxia are consistent with pneumonia. We will be treating her for community-acquired pneumonia. However, this may be complicated by empyema. I have ordered a CT scan of her chest with contrast to rule out loculation. Drainage may require catheter placement and/or decortication. Will continue to provide supplemental oxygen. Will also continue aztreonam and Levaquin as the patient appeared to have an allergic reaction to ceftriaxone and azithromycin in the Emergency Department.  2.  Hyponatremia. This is likely secondary to volume depletion. I have placed the patient on maintenance fluid.  3.  Hypophosphatemia and hypomagnesemia. I will replete both these electrolytes.  4.  Gastroesophageal reflux disease. I have written for the patient to get Zantac daily.  5.  Mild cognitive impairment. The patient has some short-term memory deficits, but does not appear to have dementia.  6.  Deep vein thrombosis prophylaxis. Heparin.  7.  Gastrointestinal prophylaxis. As above.   CODE STATUS: FULL.  TIME SPENT ON ADMISSION ORDERS AND PATIENT CARE: Approximately 35 minutes.   ____________________________ Norva Riffle. Marcille Blanco, MD msd:sb D: 03/09/2014  08:20:41 ET T: 03/09/2014 08:40:20 ET JOB#: [id number]  cc: Norva Riffle. Marcille Blanco, MD, <Dictator> Norva Riffle Angela Louis MD ELECTRONICALLY SIGNED 03/18/2014 0:45

## 2014-05-23 NOTE — Consult Note (Signed)
PATIENT NAME:  Angela Bridges, Angela Bridges MR#:  [id number] DATE OF BIRTH:  11/28/1919  HISTORY OF PRESENT ILLNESS: This is a 81 year old female, who presented to the Emergency Room with increasing shortness of breath. She had been coughing for some time and bringing up sputum. No blood in the sputum. She had been having increased shortness of breath, has been on oxygen at home, and was noting that she had needed it more and more. In addition to this, she had been having some fevers, which were low-grade. When she was seen in the Emergency Room, she was found to be hypoxic. Sats were about 86%. She had a right lower lobe infiltrate.   PAST MEDICAL HISTORY: Significant for gastroesophageal reflux disease. Has a history of a right upper lobectomy, mastectomy, and diverticulitis and partial colectomy.   SOCIAL HISTORY: She currently does not smoke or drink or use any drugs.   FAMILY HISTORY: Positive for heart disease.   MEDICATIONS: She is only on pantoprazole.   ALLERGIES: CODEINE, TRAMADOL, AZITHROMYCIN AND ROCEPHIN.   REVIEW OF SYSTEMS: A complete 12-point review of systems performed was unremarkable other than what is noted above in the HPI.   PHYSICAL EXAMINATION:  GENERAL: At the time she was seen she was awake, appeared to be comfortable without any distress.  VITAL SIGNS: Temperature was 97.8, pulse 90, respiratory 19, blood pressure is 125/65, sats were 92% on 2 liters.  NECK: Neck appeared to be supple. There was no JVD. No adenopathy. Extraocular movements are intact.  CHEST: Good air entry bilaterally. There were no rales, rhonchi. Expansion was equal.  CARDIOVASCULAR: S1, S2 is normal. Regular rhythm. No gallop or rub.  ABDOMEN: Soft and nontender.  NEUROLOGIC: She is moving all 4 extremities.   RADIOLOGICAL STUDIES: The patient had a chest x-ray done, which showed minimal pleural effusion, which was on admission and this was on the right side. The patient has had, since then, some increase  in the effusion on that right side. Also, there is some evidence of congestive heart failure. The other labs that were done show a white count 36.8 with a hemoglobin of 11.3, hematocrit 34.7. The patient's chemistries showed BUN of 17, creatinine 1.1, glucose of 192. She also had a CT of the chest done, and CT of the chest on admission had shown confirmation of the presence of a significant consolidation and some effusion. There is some loculation also noted.   IMPRESSION:  1. Acute pneumonitis.  2. Pleural effusion with underlying infiltrate.   PLAN: I would like to get an ultrasound guided thoracentesis on her. The patient has area in the right lung that does look rather suspicious and my concern would be whether there may be recurrence of tumor, which she has had in the past. Considering her age at 81 years old, we would try to keep things minimally invasive. We will try to get radiology to do the thoracentesis under ultrasound guidance as noted. Would send the fluid for cytology. We will follow along.   Thank you for consulting me in the care of this patient.  ____________________________ Allyne Gee, MD sak:ap D: 03/11/2014 22:48:12 ET T: 03/11/2014 23:02:23 ET JOB#: [id number]  cc: Allyne Gee, MD, <Dictator> Allyne Gee MD ELECTRONICALLY SIGNED 03/14/2014 12:35

## 2014-05-23 NOTE — Consult Note (Signed)
PATIENT NAME:  Angela Bridges, Angela Bridges MR#:  [id number] DATE OF BIRTH:  [birth year]  DATE OF CONSULTATION:  03/12/2014  REFERRING PHYSICIAN:   CONSULTING PHYSICIAN:  Cheral Marker. Ola Spurr, MD  REQUESTING PHYSICIAN:  Dr. Caryl Comes.     DATE OF ADMISSION: 03/09/2014.   HISTORY OF PRESENT ILLNESS: This is a very pleasant female with history of lung cancer status post resection in 1970s, as well as breast cancer, who was admitted with an acute episode of increasing cough. She states that she became very short of breath. She does report that she has a chronic cough that she has been having off and on for several months though it has been nonproductive. She has not had any weight loss prior to this. She had not been having any fevers or night sweats. On admission the patient was found to have an elevated white count as well as a right-sided infiltrate. She had a CT scan that was done to better delineate if there was an empyema although this was negative for any fluid. It did confirm some dense consolidation. Since admission the patient feels slightly better, although she still does have a deep cough. Her white count has remained elevated.   PAST MEDICAL HISTORY:  1.  Lung cancer status post resection of right upper lobe 1972.  2.  History of breast cancer.  3.  GERD.    PAST SURGICAL HISTORY: Right upper lobectomy, mastectomy, right hip replacement, right total knee replacement, partial colectomy for diverticulitis, cholecystectomy.   SOCIAL HISTORY: The patient lives with her husband. Does not smoke, drink, or use drugs.   FAMILY HISTORY: Noncontributory.   ALLERGIES: THE PATIENT IS LISTED AS BEING ALLERGIC TO AZITHROMYCIN, CODEINE, TRAMADOL, AND POSSIBLY CEFTRIAXONE.   ANTIBIOTICS SINCE ADMISSION: Include aztreonam begun February 15 as well as levofloxacin given February 17 and vancomycin given February 15 through present. She has also been on methylprednisone 60 mg IV q. 24.   REVIEW OF SYSTEMS: 11  systems reviewed and negative except as per HPI.    PHYSICAL EXAMINATION:  VITAL SIGNS: Temperature 98, pulse 88, blood pressure 148/73, respirations 18, saturation 91% on 2 liters, she has been afebrile since admission.  GENERAL: She is thin and frail. She is coughing some, but it is nonproductive.  HEENT: Pupils are reactive. Sclerae anicteric. Oropharynx is clear, but mucous membranes are dry.  NECK: Supple.  HEART: Regular.  LUNGS: Have rhonchi on the right throughout the lung.  ABDOMEN: Soft, nontender, nondistended.  EXTREMITIES: No clubbing, cyanosis, or edema.   DATA: CT of the chest February 16 showed right middle lobe and lower lobe pneumonia with areas of consolidation in the lateral right middle lobe and throughout much of the right lower lobe. There is no pleural effusion. White blood count on admission was 29.2. Currently it is 34.9. Hemoglobin 11.2, platelets 412,000. Blood cultures February 16 x 2 are negative. Urine culture February 16 negative. Sputum culture February 18, an excellent specimen with 90-100% white blood cells, few gram-positive cocci in pairs, holding for possible pathogen. Urinalysis was negative. Urine Legionella antigen is pending. Echocardiogram showed EF 55%-60%, moderate mitral valve regurgitation, mild to moderate tricuspid regurgitation.    IMPRESSION: A very pleasant and relatively healthy 81 year old admitted with right-sided pneumonia. This is in the background of a cough for several months. She has not had systemic symptoms over the last several months however. Imaging shows dense consolidation in the right lung. She has been cleared by speech and swallow and has no evidence of aspiration.  She was treated with aztreonam, vancomycin, and levofloxacin, although her white blood count is increasing she is afebrile. She is also on steroids.   I suspect part of her elevated white count is from her steroids, although it is quite high. Concerned that she may have  some obstruction that could lead to a postobstructive pneumonia, although this was not evident on CT scan. Clinically she seems afebrile and hemodynamically stable on her regimen of vancomycin, Zosyn, and levofloxacin.   RECOMMENDATIONS:  1.  Continue current antibiotics.  2.  Sputum culture should be back in the next 1-2 days.  She has grown gram-positive cocci in pairs. Base further antibiotic regimen on these results.  3.  If her pneumonia does not improve she could consider bronchoscopy to look for any endobronchial lesions especially with the prior history of lung cancer.  4.  Thank you for the consult. I will be glad to follow with you.      ____________________________ Cheral Marker. Ola Spurr, MD dpf:bu D: 03/12/2014 14:56:02 ET T: 03/12/2014 16:02:51 ET JOB#: [id number]  cc: Cheral Marker. Ola Spurr, MD, <Dictator> Kayelee Herbig Ola Spurr MD ELECTRONICALLY SIGNED 03/12/2014 23:10

## 2014-05-23 NOTE — Discharge Summary (Signed)
PATIENT NAME:  Angela Bridges, Angela Bridges MR#:  [id number] DATE OF BIRTH:  01/28/1919  DATE OF ADMISSION:  03/09/2014 DATE OF DISCHARGE:  03/22/2014  FINAL DIAGNOSES:  1. Right-sided pneumonia, likely postobstructive.  2. Right-sided lung mass, most likely primary lung cancer.  3. Anxiety.  4. History of previous lung cancer with right upper lobectomy.  5. History of breast cancer with prior mastectomy.  6. Arthritis with history of right hip and right knee replacements.  7. History of partial colectomy for diverticulitis.  8. Status post cholecystectomy.  9. Gastroesophageal reflux disease.  10. Hypertension.   HISTORY AND PHYSICAL: Please see the dictated admission history and physical.   HOSPITAL COURSE: The patient was admitted with severe right-sided pneumonia, with marked leukocytosis, white count up to 40,000. Despite broad-spectrum antibiotics, this was very difficult to get to improve, although clinically she looked better. She was evaluated by pulmonology and infectious disease, the recommendation was to go to bronchoscopy. She underwent bronchoscopy; however, she developed shortness of breath with this, and so they were not able to get much tissue. She was noted to have a mass within the airway, however, and this is most consistent with malignancy, although, again, the true etiology is unclear and the primary is unknown, at this point.   She continued to improve, recovered from her previous sepsis and the thought was that she may benefit from a follow-up with pulmonology, with consideration of repeat bronchoscopy; however, she was fairly adamant that she would not chemotherapy and was not sure that she would want radiation therapy, and so it was unclear whether she would have any change in her outcome by repeating the study. She is going to think about this and she will address it with pulmonology when she follows up with them.   She had hypomagnesemia, hypophosphatemia, hyponatremia, and  these were all corrected. She worked with physical therapy and appeared that she would need rehabilitation to regain her strength, and so plans were made for her to go to a skilled nursing facility with anticipation for transition to hospice for any worsening.   At this time, she will be discharged to the skilled nursing facility in stable condition with physical activity, up as tolerated. Physical therapy and occupational therapy should evaluate and treat the patient. She will follow a regular diet.   She is DO NOT RESUSCITATE and our facility DNR form was sent with her to the facility.   DISCHARGE MEDICATIONS:  1. Prednisone 10 mg p.o. daily x 2 days, then stop to clear the taper.  2. Magnesium hydroxide 30 mL p.o. at bedtime as needed for constipation.  3. Xanax 0.25 mg p.o. t.i.d. p.r.n. anxiety.  4. Albuterol SVN 2.5 mg q.4 h. p.r.n. wheezing.  5. Ranitidine 150 mg p.o. b.i.d.  6. Colace 100 mg p.o. b.i.d. as needed for constipation.  7. Augmentin 500 mg p.o. b.i.d. x 6 days.  8. Levofloxacin 250 mg p.o. daily x 6 days.  9.   nystatin, diphenhydramine mouthwash 10 mL p.o. 4 times a day x 7 days.  10. Ensure 3 times a day with meals.   She will not be on home oxygen. She was weaned off this during her hospitalization.     ____________________________ Adin Hector, MD bjk:JT D: 03/22/2014 08:37:21 ET T: 03/22/2014 09:07:53 ET JOB#: [id number]  cc: Adin Hector, MD, <Dictator> Ramonita Lab MD ELECTRONICALLY SIGNED 03/23/2014 12:44

## 2015-09-09 IMAGING — CR DG CHEST 1V PORT
1 series · 1 of 1 positions shown · non-contrast
Comparison: 10/29/2008.

CLINICAL DATA: [AGE] female with substernal chest pain.
Elevated blood pressure. Right mastectomy 7007. Partial right
lobectomy 6114. Initial encounter.

EXAM:
PORTABLE CHEST - 1 VIEW

[ap]
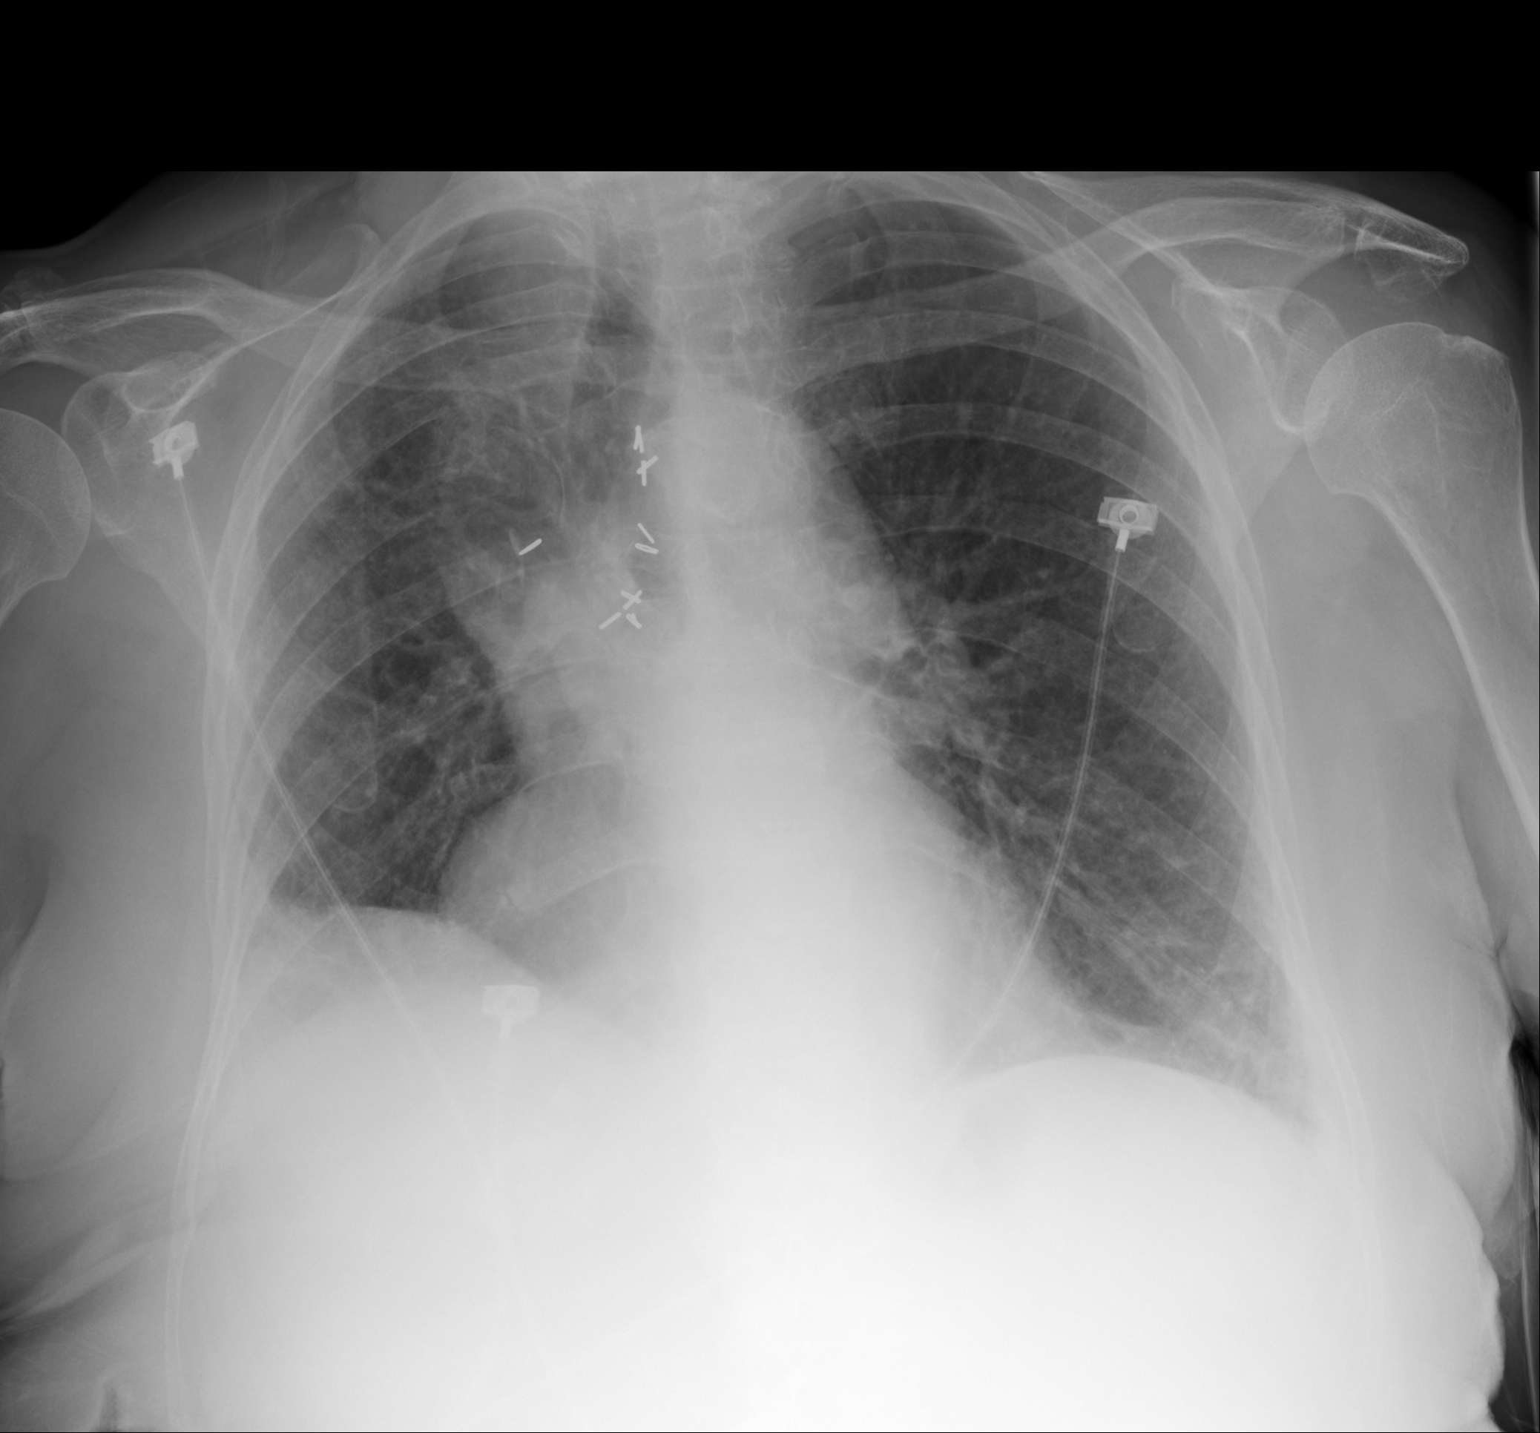

[1 of 1 positions shown; findings below may reference images not displayed]

FINDINGS: Postoperative changes right upper lung/ right suprahilar region
relatively similar to prior exam taking into account differences in
technique. Persistent elevation right hemidiaphragm and blunting
right costophrenic angle.

Calcified tortuous aorta.

Cardiomegaly.

Mild pulmonary vascular prominence.

No segmental consolidation or pneumothorax.
IMPRESSION: Postoperative changes right upper lung/ right suprahilar region
relatively similar to prior exam taking into account differences in
technique. Persistent elevation right hemidiaphragm and blunting
right costophrenic angle.

Calcified tortuous aorta.

Cardiomegaly.

Mild pulmonary vascular prominence.

No segmental consolidation or pneumothorax

## 2016-01-09 IMAGING — CR DG CHEST 2V
1 series · 2 of 2 positions shown · non-contrast
Comparison: Chest x-ray 03/09/2014, CT 03/09/2014, plain film
11/09/2013

CLINICAL DATA: [AGE] female with a history of respiratory
failure.

EXAM:
CHEST - 2 VIEW

[Series 1: x chest ap · 0.14mm/px · 2 of 2 slices shown]
[im 1/2]
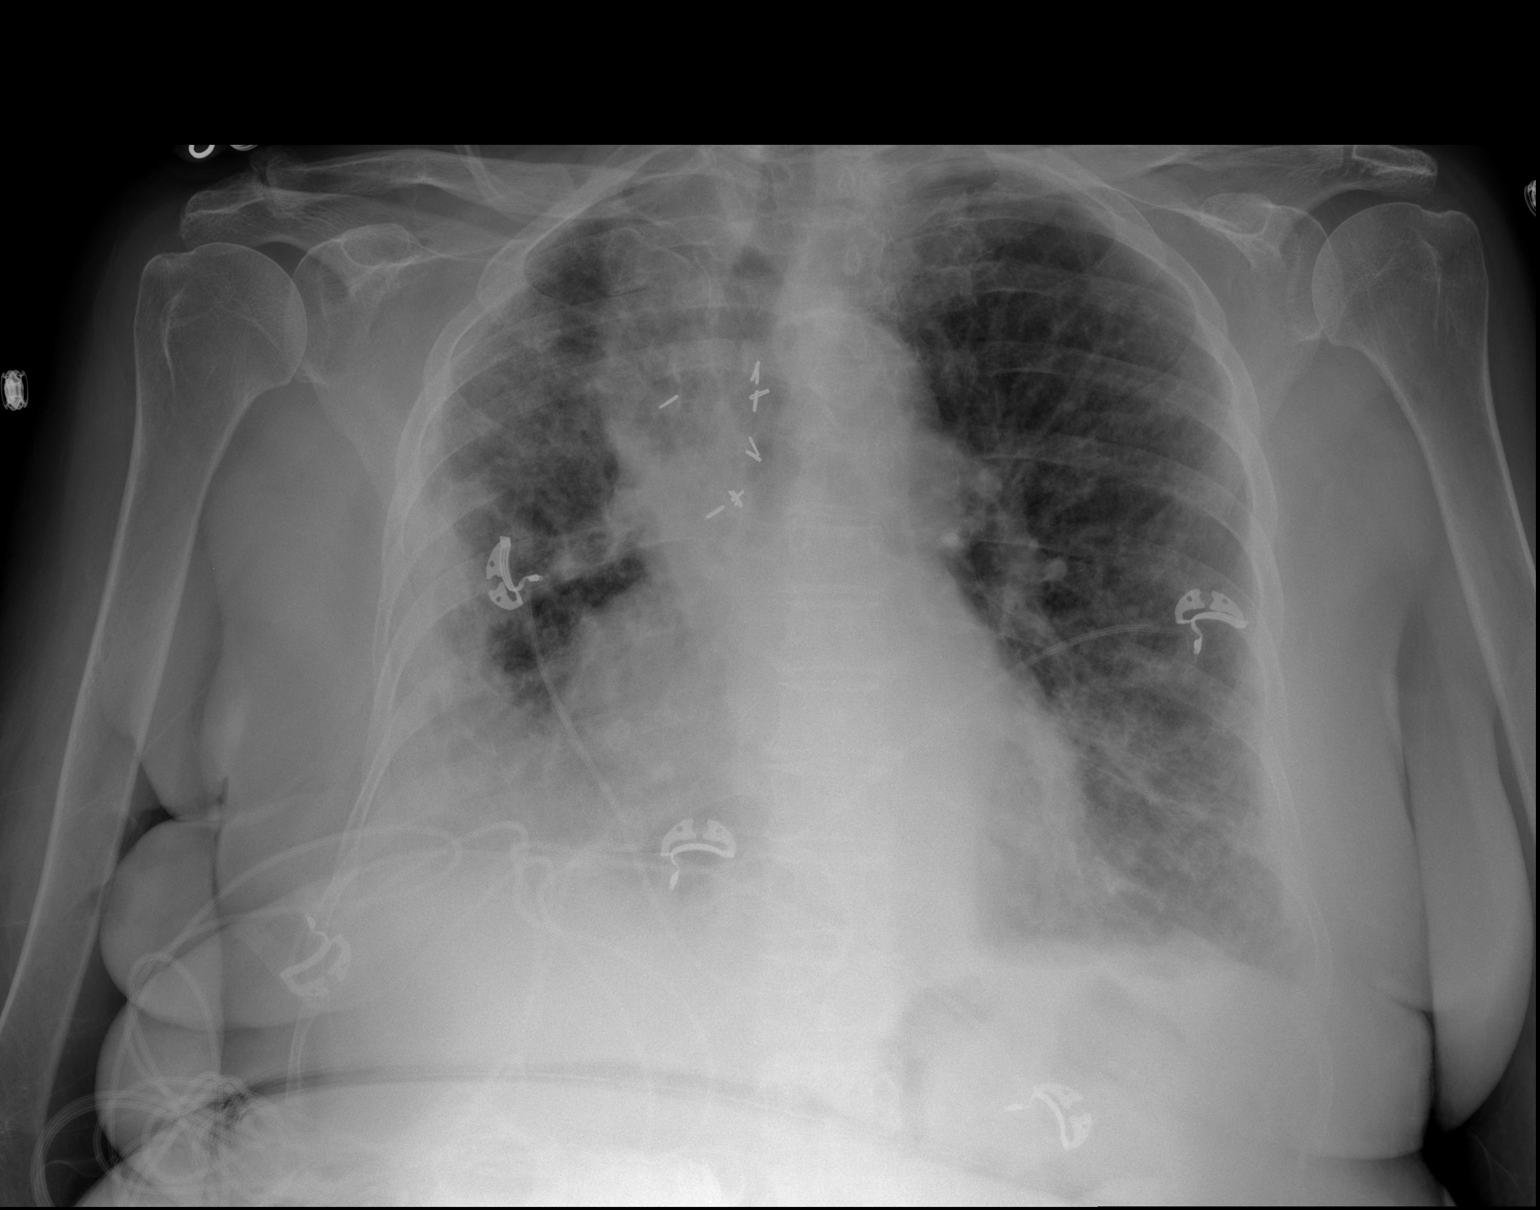
[im 2/2]
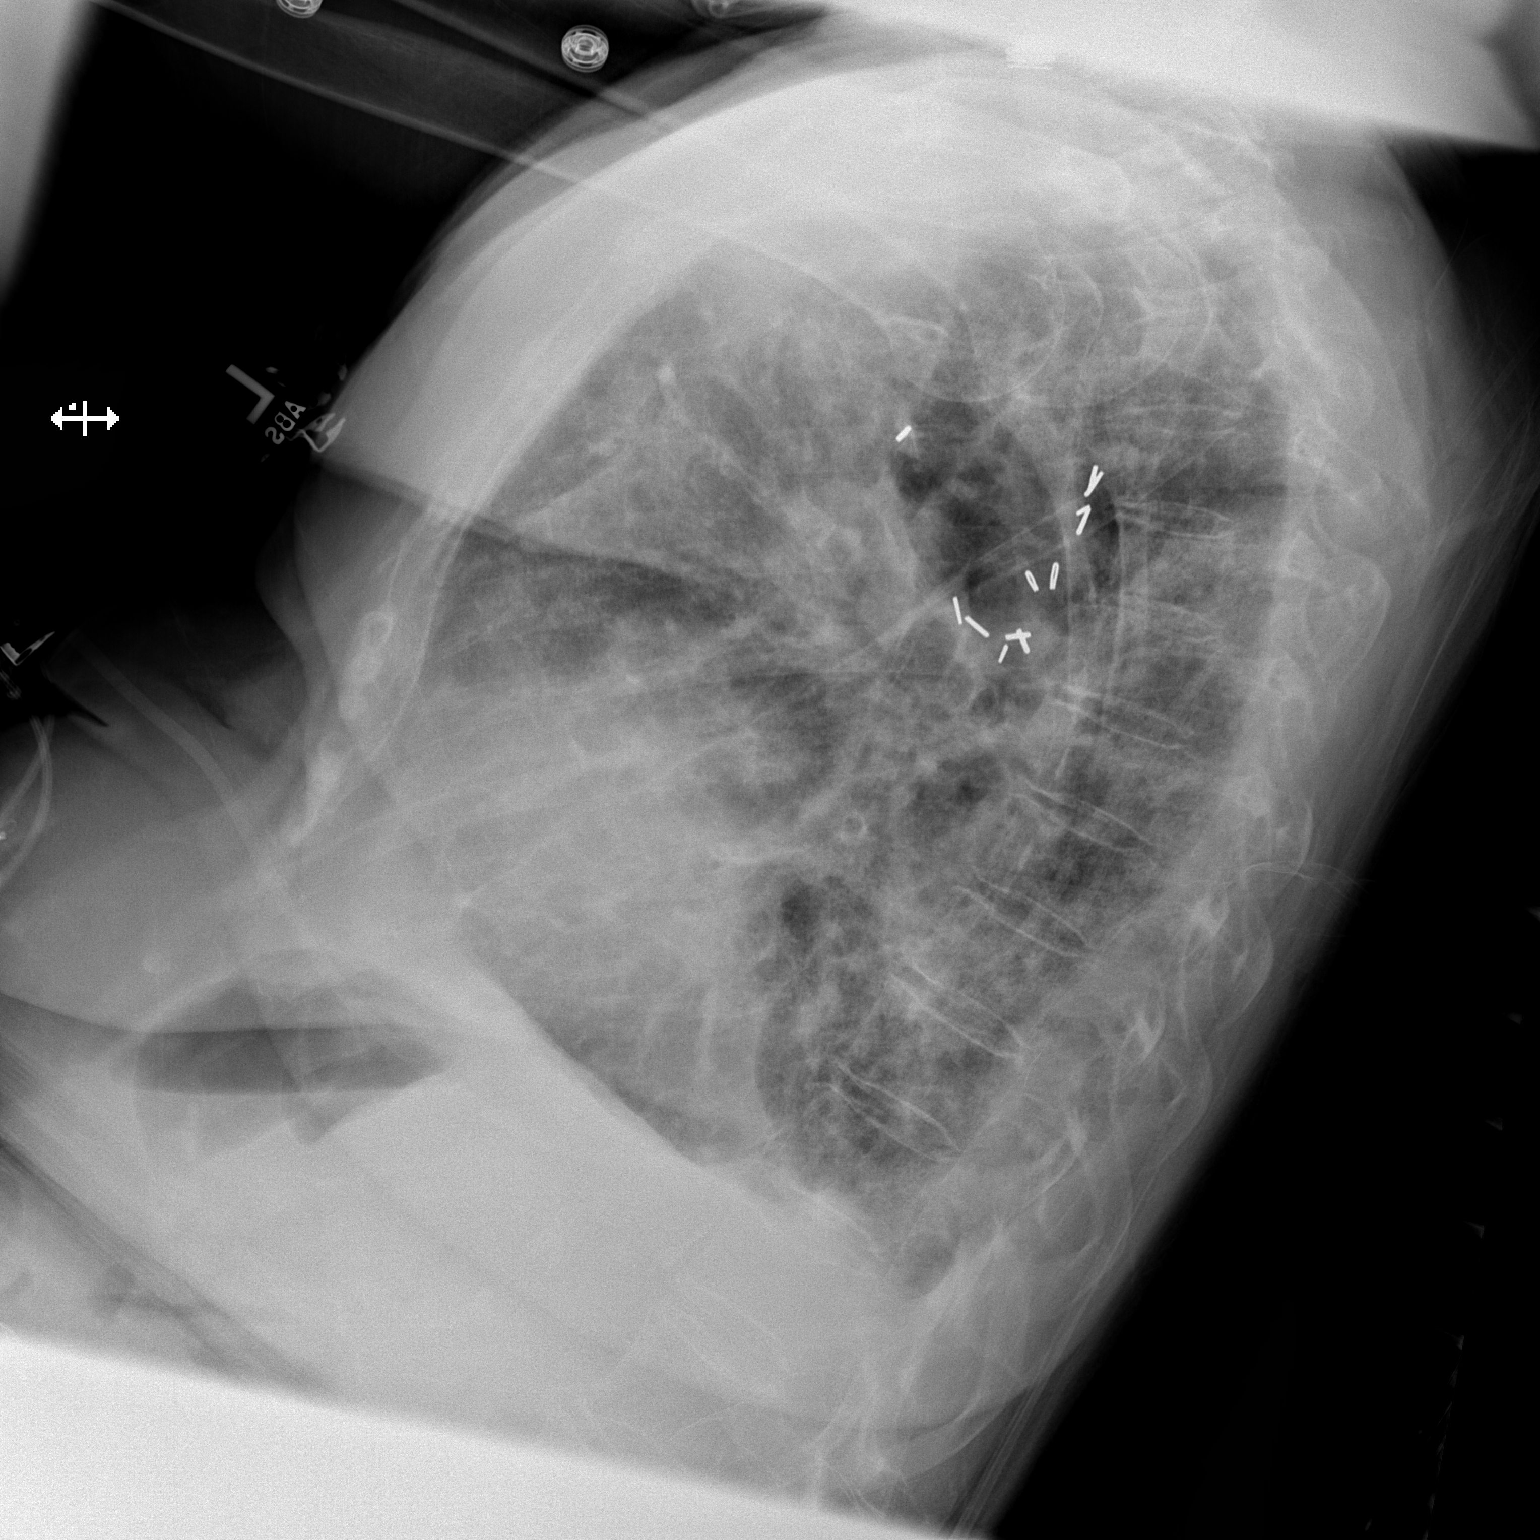

[2 of 2 positions shown; findings below may reference images not displayed]

FINDINGS: Cardiomediastinal silhouette unchanged, partially obscured by
overlying lung and pleural disease.

Persisting peritracheal opacity, a component of the shifted trachea
and right rotation.

Persisting right perihilar opacity and peripheral/basilar opacity on
the right obscuring the hemidiaphragm and right heart border.

Partial obscuration of the left hemidiaphragm with worsening
aeration at the left base.

Surgical changes along the right mediastinum as previous.

Calcifications of the aortic arch.

Trace pleural effusions on the lateral view within the costophrenic
sulcus.

Diffuse osteopenia with no displaced fracture.
IMPRESSION: Similar appearance of right perihilar and basilar consolidation.
Slightly worsened left sided aeration, with trace bilateral pleural
effusions. Follow-up chest x-ray is recommended once the patient has
cleared this acute process to assure resolution.

Atherosclerosis.

## 2016-01-09 IMAGING — CR DG CHEST 1V PORT
1 series · 2 of 2 positions shown · non-contrast
Comparison: March 11, 2014 study obtained earlier in the day

CLINICAL DATA: Central catheter placement

EXAM:
PORTABLE CHEST - 1 VIEW

[Series 1: ap · 0.17mm/px · 2 of 2 slices shown]
[im 1/2]
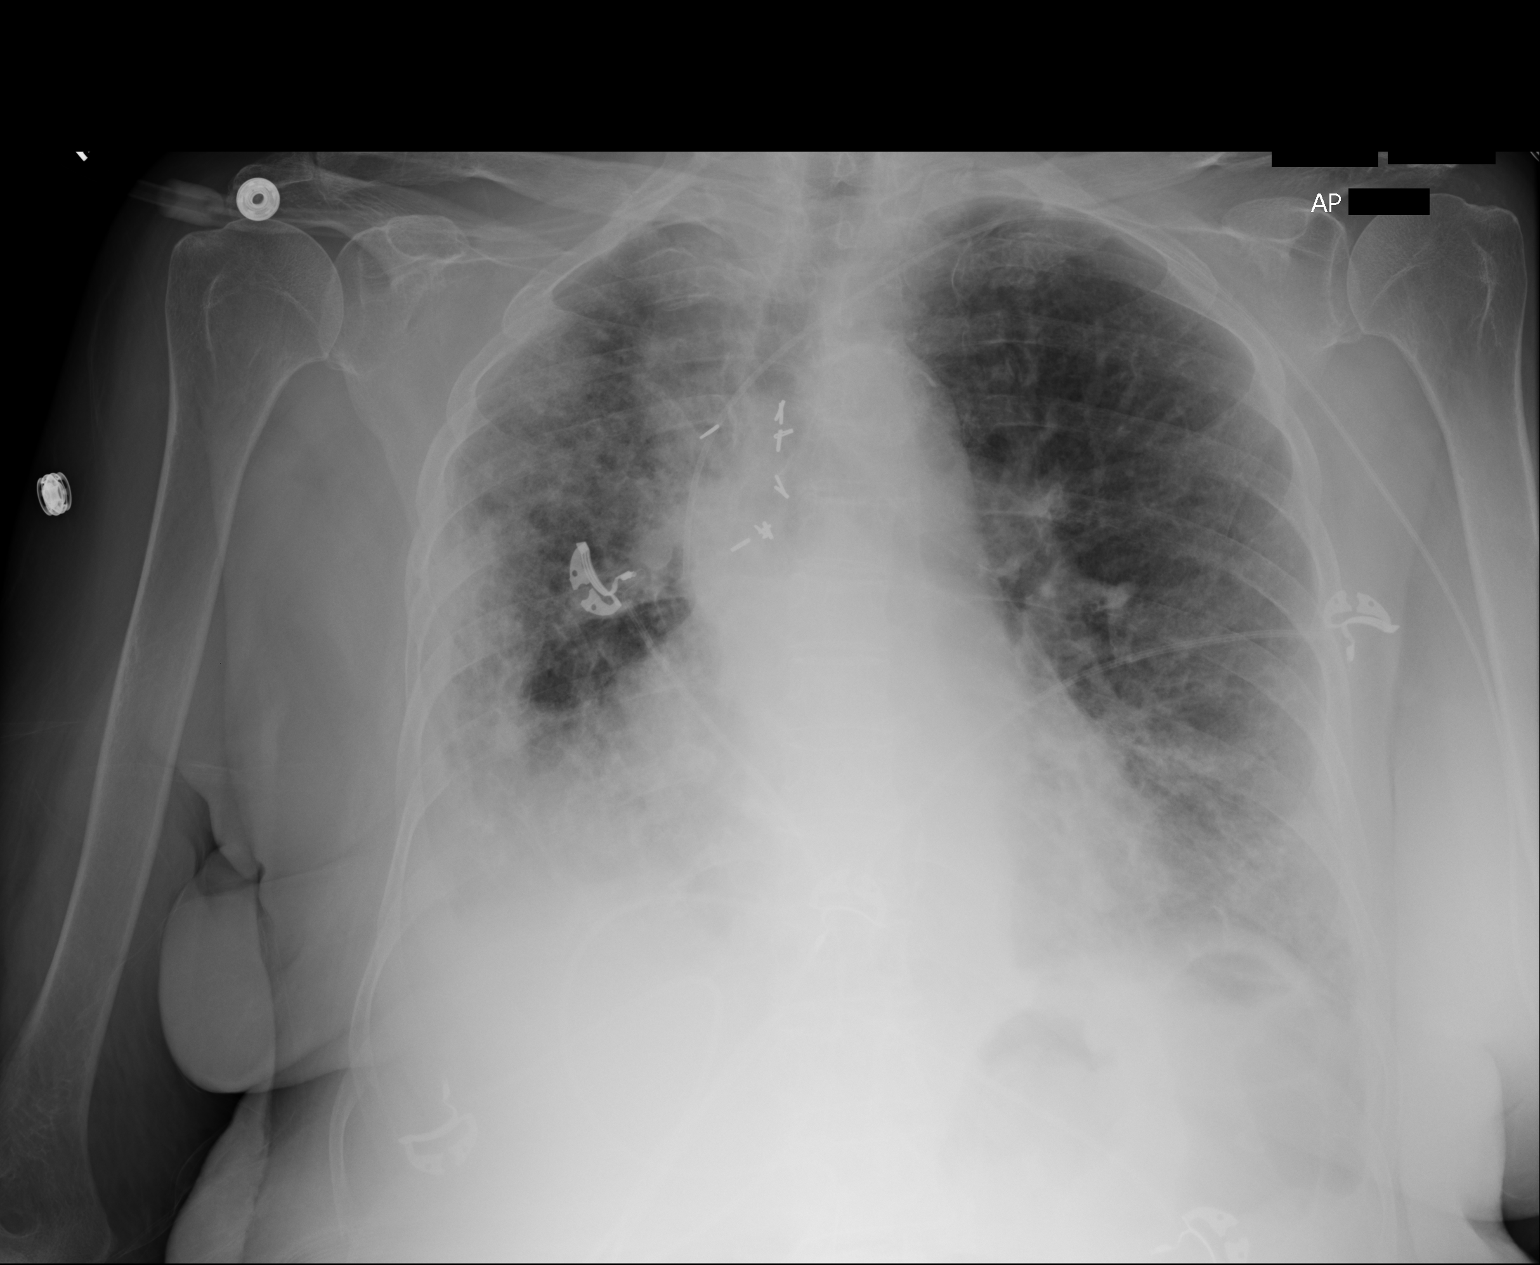
[im 2/2]
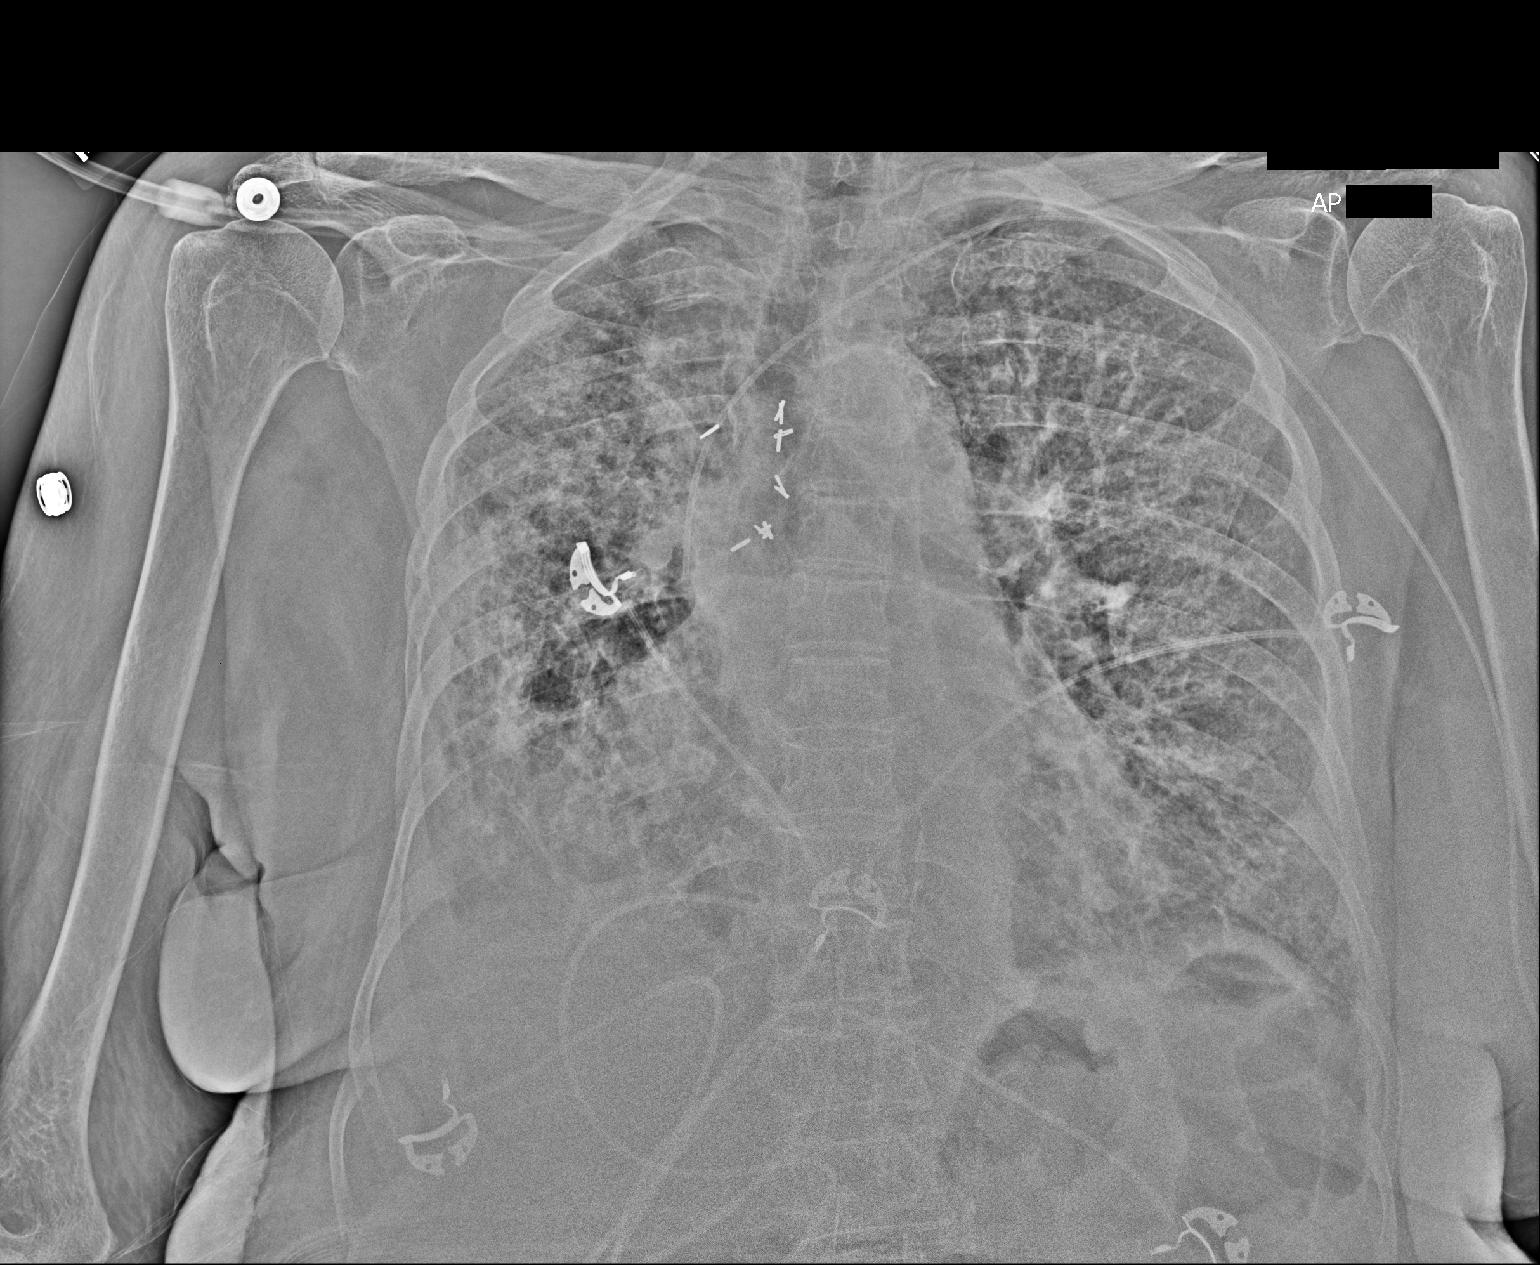

[2 of 2 positions shown; findings below may reference images not displayed]

FINDINGS: Central catheter tip is in the superior vena cava. No pneumothorax.
There is consolidation in the right base and lateral right mid lung
regions, stable. There is mild interstitial edema diffusely. Heart
is mildly enlarged with pulmonary venous hypertension. There is
postoperative change on the right, stable.
IMPRESSION: Central catheter tip in superior vena cava. No pneumothorax. Areas
of consolidation on the right appears stable. Underlying changes
suggesting congestive heart failure.

## 2016-01-10 IMAGING — US US ATTEMPTED THORACENTESIS RIGHT
1 series · 4 of 4 positions shown · non-contrast
Comparison: CT scan of the chest 03/09/2014

CLINICAL DATA: [AGE] female with multi lobar right-sided
pneumonia. Evaluate for parapneumonic effusion. If pleural fluid is
present, thoracentesis will be performed.

EXAM:
CHEST ULTRASOUND

[Series 1: us attempted thoracentesis right · 0.30mm/px · 4 of 4 slices shown]
[im 1/4]
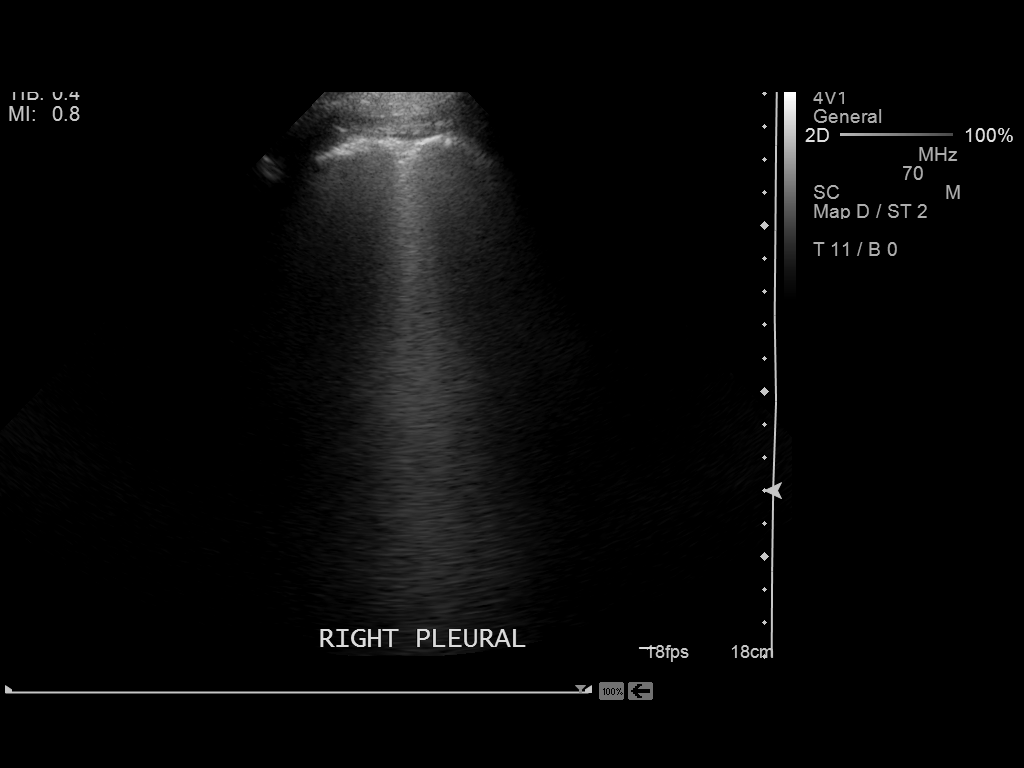
[im 2/4]
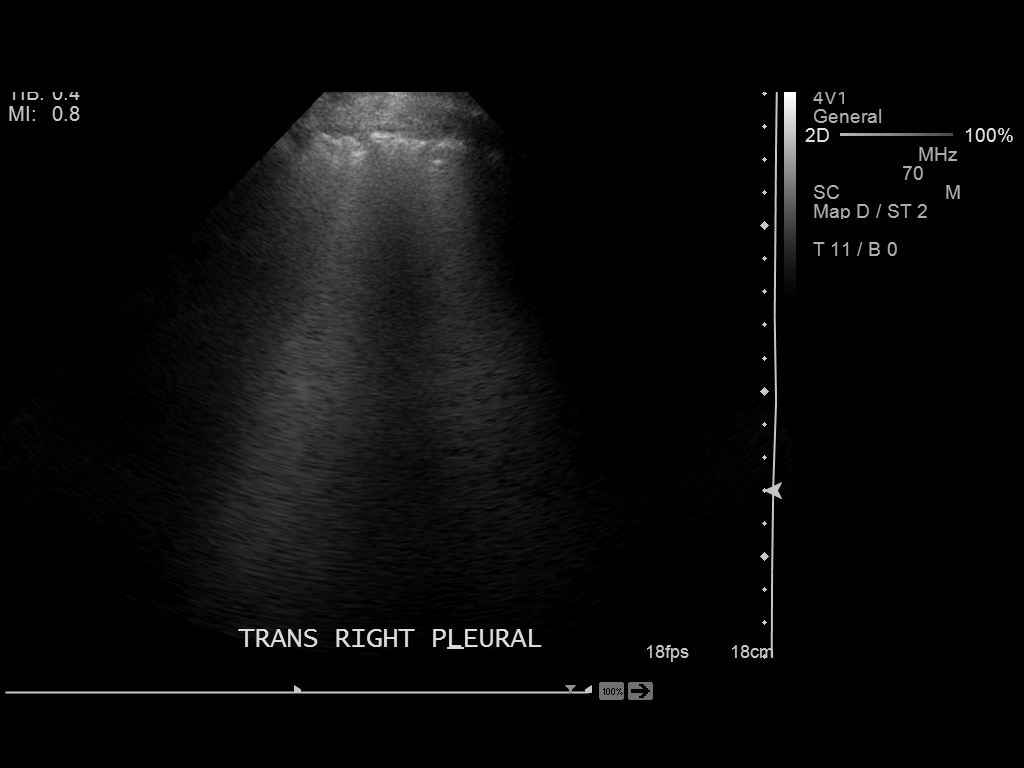
[im 3/4]
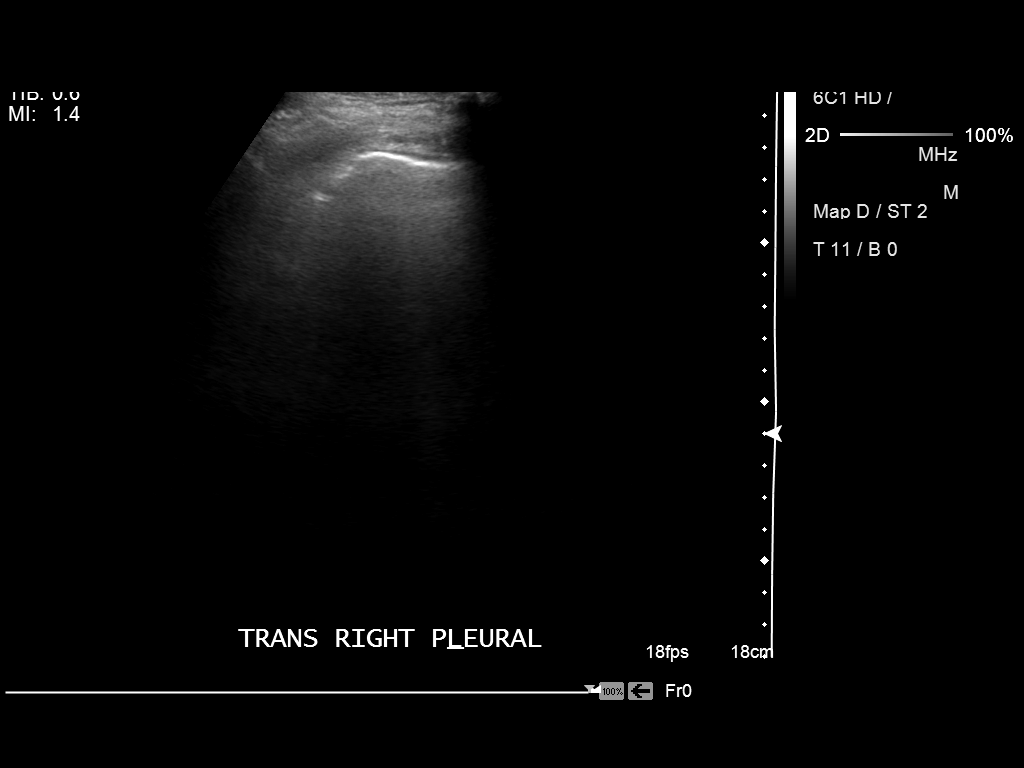
[im 4/4]
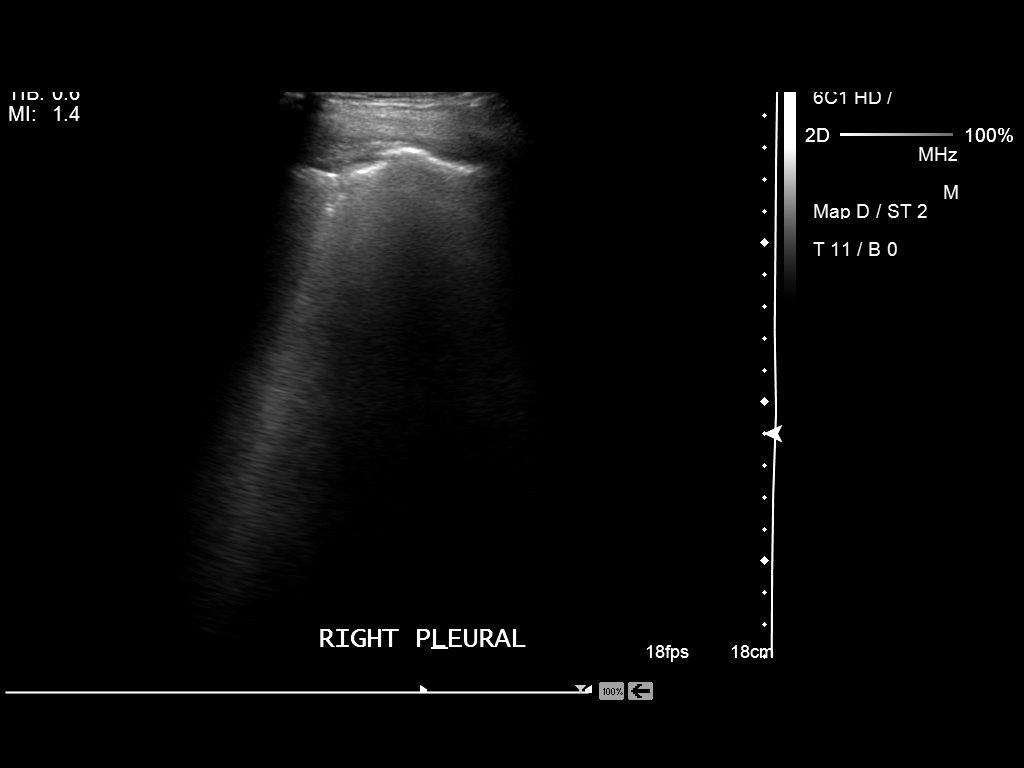

[4 of 4 positions shown; findings below may reference images not displayed]

FINDINGS: Sonographic interrogation of the right chest demonstrates no
residual pleural fluid. Thoracentesis was not performed.
IMPRESSION: No evidence of right-sided pleural effusion.

Thoracentesis was not performed.
